# Patient Record
Sex: Male | Born: 1975 | Race: Asian | Hispanic: No | Marital: Married | Smoking: Former smoker
Health system: Southern US, Community
[De-identification: ages and names within clinical notes are randomized; demographics above are authoritative.]

## PROBLEM LIST (undated history)

## (undated) DIAGNOSIS — M109 Gout, unspecified: Secondary | ICD-10-CM

## (undated) DIAGNOSIS — E669 Obesity, unspecified: Secondary | ICD-10-CM

## (undated) DIAGNOSIS — K824 Cholesterolosis of gallbladder: Secondary | ICD-10-CM

## (undated) DIAGNOSIS — K7581 Nonalcoholic steatohepatitis (NASH): Secondary | ICD-10-CM

## (undated) DIAGNOSIS — E119 Type 2 diabetes mellitus without complications: Secondary | ICD-10-CM

## (undated) DIAGNOSIS — M1990 Unspecified osteoarthritis, unspecified site: Secondary | ICD-10-CM

## (undated) DIAGNOSIS — E785 Hyperlipidemia, unspecified: Secondary | ICD-10-CM

## (undated) HISTORY — DX: Cholesterolosis of gallbladder: K82.4

## (undated) HISTORY — DX: Nonalcoholic steatohepatitis (NASH): K75.81

## (undated) HISTORY — DX: Obesity, unspecified: E66.9

## (undated) HISTORY — DX: Gout, unspecified: M10.9

## (undated) HISTORY — DX: Unspecified osteoarthritis, unspecified site: M19.90

## (undated) HISTORY — DX: Type 2 diabetes mellitus without complications: E11.9

## (undated) HISTORY — PX: TONSILLECTOMY: SUR1361

## (undated) HISTORY — DX: Hyperlipidemia, unspecified: E78.5

---
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
## 2011-03-25 HISTORY — PX: FOOT SURGERY: SHX648

## 2012-02-11 ENCOUNTER — Ambulatory Visit (INDEPENDENT_AMBULATORY_CARE_PROVIDER_SITE_OTHER): Payer: BC Managed Care – PPO | Admitting: Family Medicine

## 2012-02-11 ENCOUNTER — Encounter: Payer: Self-pay | Admitting: Family Medicine

## 2012-02-11 VITALS — BP 112/80 | HR 88 | Temp 98.0°F | Ht 63.0 in | Wt 207.0 lb

## 2012-02-11 DIAGNOSIS — E785 Hyperlipidemia, unspecified: Secondary | ICD-10-CM

## 2012-02-11 DIAGNOSIS — E669 Obesity, unspecified: Secondary | ICD-10-CM

## 2012-02-11 DIAGNOSIS — E119 Type 2 diabetes mellitus without complications: Secondary | ICD-10-CM

## 2012-02-11 DIAGNOSIS — E1140 Type 2 diabetes mellitus with diabetic neuropathy, unspecified: Secondary | ICD-10-CM | POA: Insufficient documentation

## 2012-02-11 DIAGNOSIS — M109 Gout, unspecified: Secondary | ICD-10-CM

## 2012-02-11 DIAGNOSIS — E1169 Type 2 diabetes mellitus with other specified complication: Secondary | ICD-10-CM | POA: Insufficient documentation

## 2012-02-11 DIAGNOSIS — E781 Pure hyperglyceridemia: Secondary | ICD-10-CM | POA: Insufficient documentation

## 2012-02-11 DIAGNOSIS — Z23 Encounter for immunization: Secondary | ICD-10-CM

## 2012-02-11 NOTE — Assessment & Plan Note (Signed)
Check uric acid.

## 2012-02-11 NOTE — Assessment & Plan Note (Signed)
Discussed healthy diet, avoiding white starches and decreased carbs in diet. Discussed importance of weight loss.

## 2012-02-11 NOTE — Assessment & Plan Note (Signed)
Check FLP as fasting today

## 2012-02-11 NOTE — Assessment & Plan Note (Signed)
Anticipate poor control given dietary indiscretions endorsed. States has metformin 557m at home. Will start with checking blood work and will call with results, may have him return sooner for f/u depending on results. Discussed diabetes care and reasons to treat. Declines diabetes education currently.

## 2012-02-11 NOTE — Addendum Note (Signed)
Addended by: Royann Shivers A on: 02/11/2012 11:49 AM   Modules accepted: Orders

## 2012-02-11 NOTE — Patient Instructions (Addendum)
Tdap today. Check to see which glucose meter you have at home and let me know to call in strips for you. I want you to start checking sugars at home - fasting. Blood work today - sent to [facility]. Depending on blood work results, I may have you return sooner and discuss starting diabetes medicine.  I'd like you to keep log 1 week prior to next appointment. Watch diet - low carb/starches, avoid white bread/pasta/rice.  Change to whole grain, wheat, and brown.  Avoid donuts!

## 2012-02-11 NOTE — Progress Notes (Signed)
Subjective:    Patient ID: Todd Braun, male    DOB: [birth year], 36 y.o.   MRN: [id number]  HPI CC: new pt to establish  DM - told had diabetes in past - states diet controlled.  Hasn't had a1c checked in a while.  Saw eye doctor 3 mo ago (around 10/2011).  Has LG glucose meter at home, ran out of strips.  Has LG meter.  Was taking 1 pill of 567m daily.  Gout - last flare was 3 wks ago.  3 flares in last year.  Has used colchicine in past but made him sick.  HLD - not on meds.  [place] [employer] [place]  Preventative: Last CPE was 1 yr ago. Fasting today. Declines flu shot. Tetanus shot - Tdap today.  Caffeine: 3-4 cups/night coffee Lives with wife, son (2005) and twins (2012), 1 outside cat Occupation: toolmaker Edu: HS Activity: no regular exercise. Rarely rides bike Diet: good water, fruits/vegetables occasionally  Wt Readings from Last 3 Encounters:  02/11/12 207 lb (93.895 kg)  Body mass index is 36.67 kg/(m^2).  Medications and allergies reviewed and updated in chart.  Past histories reviewed and updated if relevant as below. There is no problem list on file for this patient.  Past Medical History  Diagnosis Date  . Diabetes   . HLD (hyperlipidemia)   . Gout    Past Surgical History  Procedure Date  . Appendectomy 1996  . Tonsillectomy childhood  . Foot surgery 2013    right for arthritis   History  Substance Use Topics  . 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 Smokeless tobacco: Never Used     Comment: "smoked occasionally a long time ago"  . Alcohol Use: Yes     Comment: Socially   Family History  Problem Relation Age of Onset  . CAD Neg Hx   . Stroke Neg Hx   . Diabetes Neg Hx   . Cancer Neg Hx    No Known Allergies No current outpatient prescriptions on file prior to visit.     Review of Systems  Constitutional: Negative for fever, chills, activity change, appetite change, fatigue and unexpected weight change.  HENT:  Negative for hearing loss and neck pain.   Eyes: Positive for visual disturbance.  Respiratory: Negative for cough, chest tightness, shortness of breath and wheezing.   Cardiovascular: Negative for chest pain, palpitations and leg swelling.  Gastrointestinal: Negative for nausea, vomiting, abdominal pain, diarrhea, constipation, blood in stool and abdominal distention.  Genitourinary: Negative for hematuria and difficulty urinating.  Musculoskeletal: Negative for myalgias and arthralgias.  Skin: Negative for rash.  Neurological: Negative for dizziness, seizures, syncope and headaches.  Hematological: Does not bruise/bleed easily.  Psychiatric/Behavioral: Negative for dysphoric mood. The patient is not nervous/anxious.        Objective:   Physical Exam  Nursing note and vitals reviewed. Constitutional: He is oriented to person, place, and time. He appears well-developed and well-nourished. No distress.  HENT:  Head: Normocephalic and atraumatic.  Right Ear: Hearing, tympanic membrane, external ear and ear canal normal.  Left Ear: Hearing, tympanic membrane, external ear and ear canal normal.  Nose: Nose normal.  Mouth/Throat: Oropharynx is clear and moist. No oropharyngeal exudate.  Eyes: Conjunctivae normal and EOM are normal. Pupils are equal, round, and reactive to light. No scleral icterus.  Neck: Normal range of motion. Neck supple. Carotid bruit is not present. No thyromegaly present.  Cardiovascular: Normal rate, regular rhythm, normal  heart sounds and intact distal pulses.   No murmur heard. Pulses:      Radial pulses are 2+ on the right side, and 2+ on the left side.  Pulmonary/Chest: Effort normal and breath sounds normal. No respiratory distress. He has no wheezes. He has no rales.  Abdominal: Soft. Bowel sounds are normal. He exhibits no distension and no mass. There is no tenderness. There is no rebound and no guarding.  Musculoskeletal: Normal range of motion. He exhibits  no edema.       Diabetic foot exam: Normal inspection No skin breakdown No calluses  Normal DP/PT pulses Normal sensation to light touch and monofilament Nails normal R foot with scars from incision from recent surgery  Lymphadenopathy:    He has no cervical adenopathy.  Neurological: He is alert and oriented to person, place, and time.       CN grossly intact, station and gait intact  Skin: Skin is warm and dry. No rash noted.       innumberable skin tags bilateral axillae Some skin darkening below arms  Psychiatric: He has a normal mood and affect. His behavior is normal. Judgment and thought content normal.       Assessment & Plan:

## 2012-02-12 ENCOUNTER — Other Ambulatory Visit: Payer: Self-pay | Admitting: Family Medicine

## 2012-02-12 LAB — LIPID PANEL
Chol/HDL Ratio: 8.9 ratio units — ABNORMAL HIGH (ref 0.0–5.0)
Cholesterol, Total: 257 mg/dL — ABNORMAL HIGH (ref 100–199)
HDL: 29 mg/dL — ABNORMAL LOW (ref >39)
Triglycerides: 501 mg/dL — ABNORMAL HIGH (ref 0–149)

## 2012-02-12 LAB — COMPREHENSIVE METABOLIC PANEL
ALT: 115 IU/L — ABNORMAL HIGH (ref 0–44)
AST: 48 IU/L — ABNORMAL HIGH (ref 0–40)
Albumin/Globulin Ratio: 1.7 (ref 1.1–2.5)
Albumin: 4.6 g/dL (ref 3.5–5.5)
Alkaline Phosphatase: 71 IU/L (ref 39–117)
BUN/Creatinine Ratio: 11 (ref 8–19)
BUN: 11 mg/dL (ref 6–20)
CO2: 24 mmol/L (ref 19–28)
Calcium: 9.7 mg/dL (ref 8.7–10.2)
Chloride: 99 mmol/L (ref 97–108)
Creatinine, Ser: 1.04 mg/dL (ref 0.76–1.27)
GFR calc Af Amer: 107 mL/min/{1.73_m2} (ref >59)
GFR calc non Af Amer: 93 mL/min/{1.73_m2} (ref >59)
Globulin, Total: 2.7 g/dL (ref 1.5–4.5)
Glucose: 156 mg/dL — ABNORMAL HIGH (ref 65–99)
Potassium: 4 mmol/L (ref 3.5–5.2)
Sodium: 140 mmol/L (ref 134–144)
Total Bilirubin: 0.5 mg/dL (ref 0.0–1.2)
Total Protein: 7.3 g/dL (ref 6.0–8.5)

## 2012-02-12 LAB — HEMOGLOBIN A1C
Est. average glucose Bld gHb Est-mCnc: 217 mg/dL
Hgb A1c MFr Bld: 9.2 % — ABNORMAL HIGH (ref 4.8–5.6)

## 2012-02-12 LAB — MICROALBUMIN / CREATININE URINE RATIO
Creatinine, Ur: 192.5 mg/dL (ref 24.0–392.0)
MICROALB/CREAT RATIO: 3.1 mg/g creat (ref 0.0–30.0)
Microalbumin, Urine: 5.9 ug/mL (ref 0.0–17.0)

## 2012-02-12 LAB — URIC ACID: Uric Acid: 9.2 mg/dL — ABNORMAL HIGH (ref 3.7–8.6)

## 2012-02-12 LAB — TSH: TSH: 2.78 u[IU]/mL (ref 0.450–4.500)

## 2012-02-12 MED ORDER — FENOFIBRATE 145 MG PO TABS: 145.0000 mg | ORAL_TABLET | Freq: Every day | ORAL | Status: DC

## 2012-02-12 MED ORDER — METFORMIN HCL 500 MG PO TABS: 500.0000 mg | ORAL_TABLET | Freq: Two times a day (BID) | ORAL | Status: DC

## 2012-02-13 ENCOUNTER — Other Ambulatory Visit: Payer: Self-pay | Admitting: *Deleted

## 2012-02-13 MED ORDER — FENOFIBRATE 145 MG PO TABS: 145.0000 mg | ORAL_TABLET | Freq: Every day | ORAL | Status: DC

## 2012-02-13 MED ORDER — METFORMIN HCL 500 MG PO TABS: 500.0000 mg | ORAL_TABLET | Freq: Two times a day (BID) | ORAL | Status: DC

## 2012-02-13 MED ORDER — LANCETS 30G MISC: 1.0000 | Freq: Every day | Status: AC

## 2012-02-13 MED ORDER — GLUCOSE BLOOD VI STRP: ORAL_STRIP | Status: DC

## 2012-03-12 ENCOUNTER — Ambulatory Visit (INDEPENDENT_AMBULATORY_CARE_PROVIDER_SITE_OTHER): Payer: BC Managed Care – PPO | Admitting: Family Medicine

## 2012-03-12 ENCOUNTER — Encounter: Payer: Self-pay | Admitting: Family Medicine

## 2012-03-12 VITALS — BP 116/78 | HR 84 | Temp 97.7°F | Wt 207.0 lb

## 2012-03-12 DIAGNOSIS — R7401 Elevation of levels of liver transaminase levels: Secondary | ICD-10-CM

## 2012-03-12 DIAGNOSIS — M109 Gout, unspecified: Secondary | ICD-10-CM

## 2012-03-12 DIAGNOSIS — E119 Type 2 diabetes mellitus without complications: Secondary | ICD-10-CM

## 2012-03-12 DIAGNOSIS — K760 Fatty (change of) liver, not elsewhere classified: Secondary | ICD-10-CM | POA: Insufficient documentation

## 2012-03-12 DIAGNOSIS — K7581 Nonalcoholic steatohepatitis (NASH): Secondary | ICD-10-CM | POA: Insufficient documentation

## 2012-03-12 DIAGNOSIS — E669 Obesity, unspecified: Secondary | ICD-10-CM

## 2012-03-12 DIAGNOSIS — E785 Hyperlipidemia, unspecified: Secondary | ICD-10-CM

## 2012-03-12 MED ORDER — METFORMIN HCL 500 MG PO TABS: 500.0000 mg | ORAL_TABLET | Freq: Two times a day (BID) | ORAL | Status: DC

## 2012-03-12 MED ORDER — TRAMADOL HCL 50 MG PO TABS: 50.0000 mg | ORAL_TABLET | Freq: Two times a day (BID) | ORAL | Status: DC | PRN

## 2012-03-12 MED ORDER — FENOFIBRATE 145 MG PO TABS: 145.0000 mg | ORAL_TABLET | Freq: Every day | ORAL | Status: DC

## 2012-03-12 NOTE — Assessment & Plan Note (Signed)
New.  If remaining elevated, check iron panel, viral hepatitis panel, and abd [place]. Pt agrees with plan. Encouraged avoiding tylenol and alcohol.

## 2012-03-12 NOTE — Assessment & Plan Note (Signed)
Again encouraged weight loss.

## 2012-03-12 NOTE — Assessment & Plan Note (Signed)
Uncontrolled as of latest A1c. Reviewed diabetes care checklist, discussed implications of diabetes and monitoring of diabetes. rec schedule vision exam. rec start metformin and slowly titrate to 544m bid. Discussed may need 2nd med if doesn't take diet/lifestyle changes seriously. rec start keeping track of sugars, return in 257moith 1 wk log of sugars prior to next appt. Prescribed type 2 diabetes overview and primer on checking sugars via [employer].

## 2012-03-12 NOTE — Patient Instructions (Addendum)
You have diabetes with an A1c of 9.2% Goal sugar if fasting is 80-120. Goal sugar if 2 hours after meal <200. Let's start checking sugars either fasting in am or 2 hours after eating.   Let's start metformin at 588m nightly for 1 week, then increase to 5059mtwice daily. If any more gout attacks (joint pains), we will need to consider starting gout medicine to help decrease uric acid in body. We need to monitor liver function as yours was slightly elevated last check.  Hopefull ywith better sugar control liver will normalize, if not we will obtain abdominal ultrasound. Return in 2 months for follow up, prior come in for blood work if you can. [employer] prescription for online diabetes education provided today.

## 2012-03-12 NOTE — Progress Notes (Signed)
  Subjective:    Patient ID: Todd Braun, male    DOB: 10-Dec-1975, 36 y.o.   MRN: [id number]  HPI CC: 1 mo f/u  DM - A1c was 9.2% last month.  Advised to start metformin, did not start.  Say eye doctor 10/2011.  Has LG glucose meter at home, refilled strips last visit, but has not started .  Some paresthesias of bilateral big toes.  Gout - last flare was 3 wks ago. 3 flares in last year. Has used colchicine in past but made him sick. States was drinking lots of liquor and beer.  States has backed off of this.  HLD - found to have hypertriglyceridemia.  transaminitis - doesn't use tylenol.  Did have increased EtOH intake recently, attributes to this.  Has backed off EtOH.  Denies hepatitis exposure.  Doesn't know if has had Hep A or B shots.  No h/o HTN.  Past Medical History  Diagnosis Date  . T2DM (type 2 diabetes mellitus)   . HLD (hyperlipidemia)   . Gout   . Obesity     Review of Systems Per HPI    Objective:   Physical Exam  Nursing note and vitals reviewed. Constitutional: He appears well-developed and well-nourished. No distress.       obese  HENT:  Head: Normocephalic and atraumatic.  Mouth/Throat: Oropharynx is clear and moist. No oropharyngeal exudate.  Eyes: Conjunctivae normal and EOM are normal. Pupils are equal, round, and reactive to light. No scleral icterus.  Neck: Normal range of motion. Neck supple.  Cardiovascular: Normal rate, regular rhythm, normal heart sounds and intact distal pulses.   No murmur heard. Pulmonary/Chest: Effort normal and breath sounds normal. No respiratory distress. He has no wheezes. He has no rales.  Musculoskeletal: He exhibits no edema.  Skin: Skin is warm and dry. No rash noted.  Psychiatric: He has a normal mood and affect.       Assessment & Plan:

## 2012-03-12 NOTE — Assessment & Plan Note (Addendum)
Lab Results  Component Value Date   HDL 29* 02/11/2012   LDLCALC Comment 02/11/2012   TRIG 501* 02/11/2012   CHOLHDL 8.9* 02/11/2012  compliant and tolerant of fenofibrate.

## 2012-03-12 NOTE — Assessment & Plan Note (Signed)
UA elevated at 9's. Discussed if rpt flare, will recommend start allopurinol. Lab Results  Component Value Date   CREATININE 1.04 02/11/2012

## 2012-05-08 ENCOUNTER — Other Ambulatory Visit: Payer: Self-pay

## 2012-05-19 ENCOUNTER — Ambulatory Visit (INDEPENDENT_AMBULATORY_CARE_PROVIDER_SITE_OTHER): Payer: BC Managed Care – PPO | Admitting: Family Medicine

## 2012-05-19 ENCOUNTER — Encounter: Payer: Self-pay | Admitting: Family Medicine

## 2012-05-19 VITALS — BP 114/72 | HR 88 | Temp 97.8°F | Wt 205.8 lb

## 2012-05-19 DIAGNOSIS — E1142 Type 2 diabetes mellitus with diabetic polyneuropathy: Secondary | ICD-10-CM

## 2012-05-19 DIAGNOSIS — E781 Pure hyperglyceridemia: Secondary | ICD-10-CM

## 2012-05-19 DIAGNOSIS — E1140 Type 2 diabetes mellitus with diabetic neuropathy, unspecified: Secondary | ICD-10-CM

## 2012-05-19 DIAGNOSIS — M109 Gout, unspecified: Secondary | ICD-10-CM

## 2012-05-19 DIAGNOSIS — E1149 Type 2 diabetes mellitus with other diabetic neurological complication: Secondary | ICD-10-CM

## 2012-05-19 DIAGNOSIS — R7401 Elevation of levels of liver transaminase levels: Secondary | ICD-10-CM

## 2012-05-19 LAB — LIPID PANEL
Cholesterol: 209 mg/dL — ABNORMAL HIGH (ref 0–200)
HDL: 30.2 mg/dL — ABNORMAL LOW (ref >39.00)
Total CHOL/HDL Ratio: 7
Triglycerides: 333 mg/dL — ABNORMAL HIGH (ref 0.0–149.0)
VLDL: 66.6 mg/dL — ABNORMAL HIGH (ref 0.0–40.0)

## 2012-05-19 LAB — COMPREHENSIVE METABOLIC PANEL
ALT: 77 U/L — ABNORMAL HIGH (ref 0–53)
AST: 36 U/L (ref 0–37)
Albumin: 4.4 g/dL (ref 3.5–5.2)
Alkaline Phosphatase: 43 U/L (ref 39–117)
BUN: 12 mg/dL (ref 6–23)
CO2: 28 mEq/L (ref 19–32)
Calcium: 9.5 mg/dL (ref 8.4–10.5)
Chloride: 103 mEq/L (ref 96–112)
Creatinine, Ser: 1 mg/dL (ref 0.4–1.5)
GFR: 86.13 mL/min (ref >60.00)
Glucose, Bld: 89 mg/dL (ref 70–99)
Potassium: 3.4 mEq/L — ABNORMAL LOW (ref 3.5–5.1)
Sodium: 139 mEq/L (ref 135–145)
Total Bilirubin: 0.6 mg/dL (ref 0.3–1.2)
Total Protein: 8 g/dL (ref 6.0–8.3)

## 2012-05-19 LAB — LDL CHOLESTEROL, DIRECT: Direct LDL: 139.6 mg/dL

## 2012-05-19 LAB — HEMOGLOBIN A1C: Hgb A1c MFr Bld: 7.2 % — ABNORMAL HIGH (ref 4.6–6.5)

## 2012-05-19 MED ORDER — BLOOD GLUCOSE METER KIT: PACK | Status: DC

## 2012-05-19 MED ORDER — METFORMIN HCL ER 750 MG PO TB24: 750.0000 mg | ORAL_TABLET | Freq: Every day | ORAL | Status: DC

## 2012-05-19 NOTE — Assessment & Plan Note (Signed)
recheck FLP today.  Tolerating fenofibrate.

## 2012-05-19 NOTE — Assessment & Plan Note (Signed)
recheck today.

## 2012-05-19 NOTE — Patient Instructions (Addendum)
Diabetes checklist provided today.  Also re-prescribed online resource. Start long acting metformin once daily in the morning. Schedule eye exam. return in 3 months for follow up

## 2012-05-19 NOTE — Assessment & Plan Note (Addendum)
Chronic, uncontrolled. Intolerant of IR metformin - will try extended release metformin. Somewhat resistant to meds. Recommended schedule vision exam. I've resent glucose meter to pharmacy. Declines DSME. Foot exam normal today but pt continues to endorse paresthesias. emmi solutions prescribed today.

## 2012-05-19 NOTE — Assessment & Plan Note (Signed)
Stable

## 2012-05-19 NOTE — Progress Notes (Signed)
  Subjective:    Patient ID: Todd Braun, male    DOB: 01-07-1976, 37 y.o.   MRN: [id number]  HPI CC: f/u DM  DM - takes metformin only once daily.  Finds causing diarrhea.  Has only been taking metformin 550m once daily 2/2 this.  glucometer broke.  Has a lot of LG strips.  No recent eye exam.  Foot exam today.  Some foot paresthesias endorsed, worse with prolonged standing. Lab Results  Component Value Date   HGBA1C 9.2* 02/11/2012    Gout - no recent flares  Transaminitis - knows to avoid a lot of alcohol and tylenol  Hypertriglyceridemia - started tricor last visit.  Tolerating well.  Past Medical History  Diagnosis Date  . T2DM (type 2 diabetes mellitus)   . HLD (hyperlipidemia)   . Gout   . Obesity      Review of Systems Per HPI    Objective:   Physical Exam  Nursing note and vitals reviewed. Constitutional: He appears well-developed and well-nourished. No distress.  HENT:  Head: Normocephalic and atraumatic.  Right Ear: External ear normal.  Left Ear: External ear normal.  Nose: Nose normal.  Mouth/Throat: Oropharynx is clear and moist. No oropharyngeal exudate.  Eyes: Conjunctivae and EOM are normal. Pupils are equal, round, and reactive to light. No scleral icterus.  Neck: Normal range of motion. Neck supple.  Cardiovascular: Normal rate, regular rhythm, normal heart sounds and intact distal pulses.   No murmur heard. Pulmonary/Chest: Effort normal and breath sounds normal. No respiratory distress. He has no wheezes. He has no rales.  Musculoskeletal: He exhibits no edema.  Diabetic foot exam: Normal inspection No skin breakdown No calluses  Normal DP/PT pulses Normal sensation to light tough and monofilament Nails normal  Lymphadenopathy:    He has no cervical adenopathy.  Skin: Skin is warm and dry. No rash noted.  Psychiatric: He has a normal mood and affect.       Assessment & Plan:

## 2012-08-18 ENCOUNTER — Encounter: Payer: Self-pay | Admitting: Family Medicine

## 2012-08-18 ENCOUNTER — Ambulatory Visit (INDEPENDENT_AMBULATORY_CARE_PROVIDER_SITE_OTHER): Payer: BC Managed Care – PPO | Admitting: Family Medicine

## 2012-08-18 VITALS — BP 124/88 | HR 80 | Temp 98.1°F | Wt 203.8 lb

## 2012-08-18 DIAGNOSIS — E1140 Type 2 diabetes mellitus with diabetic neuropathy, unspecified: Secondary | ICD-10-CM

## 2012-08-18 DIAGNOSIS — E669 Obesity, unspecified: Secondary | ICD-10-CM

## 2012-08-18 DIAGNOSIS — E1149 Type 2 diabetes mellitus with other diabetic neurological complication: Secondary | ICD-10-CM

## 2012-08-18 DIAGNOSIS — E1142 Type 2 diabetes mellitus with diabetic polyneuropathy: Secondary | ICD-10-CM

## 2012-08-18 DIAGNOSIS — E1165 Type 2 diabetes mellitus with hyperglycemia: Secondary | ICD-10-CM

## 2012-08-18 DIAGNOSIS — E781 Pure hyperglyceridemia: Secondary | ICD-10-CM

## 2012-08-18 LAB — HEMOGLOBIN A1C: Hgb A1c MFr Bld: 7.1 % — ABNORMAL HIGH (ref 4.6–6.5)

## 2012-08-18 LAB — BASIC METABOLIC PANEL
BUN: 12 mg/dL (ref 6–23)
CO2: 26 mEq/L (ref 19–32)
Calcium: 9.3 mg/dL (ref 8.4–10.5)
Chloride: 105 mEq/L (ref 96–112)
Creatinine, Ser: 1 mg/dL (ref 0.4–1.5)
GFR: 95.48 mL/min (ref >60.00)
Glucose, Bld: 95 mg/dL (ref 70–99)
Potassium: 4 mEq/L (ref 3.5–5.1)
Sodium: 137 mEq/L (ref 135–145)

## 2012-08-18 MED ORDER — FENOFIBRATE 145 MG PO TABS: 145.0000 mg | ORAL_TABLET | Freq: Every day | ORAL | Status: DC

## 2012-08-18 MED ORDER — METFORMIN HCL ER 750 MG PO TB24: 750.0000 mg | ORAL_TABLET | Freq: Every day | ORAL | Status: DC

## 2012-08-18 NOTE — Assessment & Plan Note (Signed)
Recommended refill tricor.

## 2012-08-18 NOTE — Assessment & Plan Note (Addendum)
Encouraged continued weight loss through diabetic diet compliance.

## 2012-08-18 NOTE — Progress Notes (Signed)
  Subjective:    Patient ID: Todd Braun, male    DOB: [birth year], 37 y.o.   MRN: [id number]  HPI CC: 78mof/u DM  THuttonreturns today for 3 mo f/u DM  DM - taking IR metformin 503mdaily still 2/2 insurance concerns (needed XR form sent [facility] but didn't tell me).  Has taken extended release in past and tolerated well. Did not pick up glucose meter so has not been checking sugars. Avoiding sweets. This month hasn't been as compliant with diet as prior. Declines DSME.  Did not complete [employer] education prescribed twice in past. Denies paresthesias. Taking herbal supplement that wife bought for diabetes, unsure what this is. Lab Results  Component Value Date   HGBA1C 7.2* 05/19/2012    Wt Readings from Last 3 Encounters:  08/18/12 203 lb 12 oz (92.42 kg)  05/19/12 205 lb 12 oz (93.328 kg)  03/12/12 207 lb (93.895 kg)    Hypertriglyceridemia - ran out of fibrate 2 wks ago, didn't refill.  Prior was tolerating well.  Sedentary at work.  Works weekend shift.  Past Medical History  Diagnosis Date  . T2DM (type 2 diabetes mellitus)   . HLD (hyperlipidemia)   . Gout   . Obesity     Review of Systems Per HPI    Objective:   Physical Exam  Nursing note and vitals reviewed. Constitutional: He appears well-developed and well-nourished. No distress.  HENT:  Head: Normocephalic and atraumatic.  Mouth/Throat: Oropharynx is clear and moist. No oropharyngeal exudate.  Eyes: Conjunctivae and EOM are normal. Pupils are equal, round, and reactive to light. No scleral icterus.  Neck: Normal range of motion. Neck supple.  Cardiovascular: Normal rate, regular rhythm, normal heart sounds and intact distal pulses.   No murmur heard. Pulmonary/Chest: Effort normal and breath sounds normal. No respiratory distress. He has no wheezes. He has no rales.  Musculoskeletal: He exhibits no edema.  Lymphadenopathy:    He has no cervical adenopathy.  Skin: Skin is warm and dry. No  rash noted.  Psychiatric: He has a normal mood and affect.       Assessment & Plan:

## 2012-08-18 NOTE — Assessment & Plan Note (Addendum)
Chronic, uncontrolled. Difficult to control as he doesn't check sugars and isn't compliant with med recommendations. Currently taking metformin 581m IR daily. I've sent in metformin 755mXR daily [facility], advised him to start taking. Again discussed diabetes is a chronic disease that needs daily monitoring/treatment. Recommended pick up glucose meter (second time he doesn't pick this up) - any type that has cheapest test strips. A1c today. When returns, I've asked him to bring in 1 wk log of sugars to review.

## 2012-08-18 NOTE — Patient Instructions (Addendum)
Bring in herbal supplement. Check with [facility] about fenofibrate (cholesterol medicine). Good to see you today, call us with questions. Pick up glucose meter to keep track of sugars. Return in 6 months for follow up.  Keep log of fasting sugars 1 week prior to appointment and bring into next appointment Goal fasting sugar is 80-120, goal sugar 2 hours after meal is <180.

## 2012-09-30 ENCOUNTER — Other Ambulatory Visit: Payer: Self-pay

## 2012-11-22 LAB — HM DIABETES EYE EXAM

## 2013-01-27 ENCOUNTER — Other Ambulatory Visit: Payer: Self-pay

## 2013-02-23 ENCOUNTER — Encounter: Payer: Self-pay | Admitting: Family Medicine

## 2013-02-23 ENCOUNTER — Ambulatory Visit (INDEPENDENT_AMBULATORY_CARE_PROVIDER_SITE_OTHER): Payer: BC Managed Care – PPO | Admitting: Family Medicine

## 2013-02-23 VITALS — BP 108/68 | HR 88 | Temp 98.4°F | Ht 63.75 in | Wt 213.0 lb

## 2013-02-23 DIAGNOSIS — E1149 Type 2 diabetes mellitus with other diabetic neurological complication: Secondary | ICD-10-CM

## 2013-02-23 DIAGNOSIS — E781 Pure hyperglyceridemia: Secondary | ICD-10-CM

## 2013-02-23 DIAGNOSIS — Z23 Encounter for immunization: Secondary | ICD-10-CM

## 2013-02-23 DIAGNOSIS — M109 Gout, unspecified: Secondary | ICD-10-CM

## 2013-02-23 DIAGNOSIS — J069 Acute upper respiratory infection, unspecified: Secondary | ICD-10-CM

## 2013-02-23 DIAGNOSIS — E1140 Type 2 diabetes mellitus with diabetic neuropathy, unspecified: Secondary | ICD-10-CM

## 2013-02-23 DIAGNOSIS — E1142 Type 2 diabetes mellitus with diabetic polyneuropathy: Secondary | ICD-10-CM

## 2013-02-23 LAB — LIPID PANEL
Cholesterol: 252 mg/dL — ABNORMAL HIGH (ref 0–200)
HDL: 31.3 mg/dL — ABNORMAL LOW (ref >39.00)
Total CHOL/HDL Ratio: 8
Triglycerides: 367 mg/dL — ABNORMAL HIGH (ref 0.0–149.0)
VLDL: 73.4 mg/dL — ABNORMAL HIGH (ref 0.0–40.0)

## 2013-02-23 LAB — LDL CHOLESTEROL, DIRECT: Direct LDL: 153.9 mg/dL

## 2013-02-23 LAB — BASIC METABOLIC PANEL
BUN: 18 mg/dL (ref 6–23)
CO2: 27 mEq/L (ref 19–32)
Calcium: 9.7 mg/dL (ref 8.4–10.5)
Chloride: 101 mEq/L (ref 96–112)
Creatinine, Ser: 1.1 mg/dL (ref 0.4–1.5)
GFR: 82.99 mL/min (ref >60.00)
Glucose, Bld: 131 mg/dL — ABNORMAL HIGH (ref 70–99)
Potassium: 4.1 mEq/L (ref 3.5–5.1)
Sodium: 137 mEq/L (ref 135–145)

## 2013-02-23 LAB — HEMOGLOBIN A1C: Hgb A1c MFr Bld: 8 % — ABNORMAL HIGH (ref 4.6–6.5)

## 2013-02-23 LAB — MICROALBUMIN / CREATININE URINE RATIO
Creatinine,U: 102.9 mg/dL
Microalb Creat Ratio: 0.9 mg/g (ref 0.0–30.0)
Microalb, Ur: 0.9 mg/dL (ref 0.0–1.9)

## 2013-02-23 NOTE — Progress Notes (Signed)
Pre-visit discussion using our clinic review tool. No additional management support is needed unless otherwise documented below in the visit note.  

## 2013-02-23 NOTE — Assessment & Plan Note (Signed)
Anticipate viral - discussed this.  Update if sxs persist.

## 2013-02-23 NOTE — Assessment & Plan Note (Signed)
Stable on tricor - continue. Discussed increased fish in diet, handout from [employer] provided.

## 2013-02-23 NOTE — Addendum Note (Signed)
Addended by: Ria Bush on: 02/23/2013 11:21 AM   Modules accepted: Orders

## 2013-02-23 NOTE — Progress Notes (Signed)
   Subjective:    Patient ID: Todd Braun, male    DOB: [birth year], 37 y.o.   MRN: [id number]  HPI CC: DM f/u  DM - off metformin because all types caused diarrhea.  Doesn't check sugars.  Declined DSME.  Home glucometer is broken.  Foot exam today.  States had eye exam 3-4 mo ago.  No hypoglycemic sxs.  No paresthesias.  No chest pain/tightness.   Hypertriglyceridemia - tolerating fenofibrate.  Gout - no recent flares.  Some congestion, ST, cough.  No fevers.  No HA, facial pressure.  No sick contacts at home.  No smokers at home.  Wt Readings from Last 3 Encounters:  02/23/13 213 lb (96.616 kg)  08/18/12 203 lb 12 oz (92.42 kg)  05/19/12 205 lb 12 oz (93.328 kg)    Past Medical History  Diagnosis Date  . T2DM (type 2 diabetes mellitus)   . HLD (hyperlipidemia)   . Gout   . Obesity      Review of Systems Per HPI    Objective:   Physical Exam  Nursing note and vitals reviewed. Constitutional: He appears well-developed and well-nourished. No distress.  HENT:  Head: Normocephalic and atraumatic.  Right Ear: Tympanic membrane, external ear and ear canal normal.  Left Ear: Tympanic membrane, external ear and ear canal normal.  Nose: Nose normal. No mucosal edema or rhinorrhea. Right sinus exhibits no maxillary sinus tenderness and no frontal sinus tenderness. Left sinus exhibits no maxillary sinus tenderness and no frontal sinus tenderness.  Mouth/Throat: Oropharynx is clear and moist. No oropharyngeal exudate.  Eyes: Conjunctivae and EOM are normal. Pupils are equal, round, and reactive to light. No scleral icterus.  Neck: Normal range of motion. Neck supple.  Cardiovascular: Normal rate, regular rhythm, normal heart sounds and intact distal pulses.   No murmur heard. Pulmonary/Chest: Effort normal and breath sounds normal. No respiratory distress. He has no wheezes. He has no rales.  Musculoskeletal: He exhibits no edema.  Diabetic foot exam: Normal inspection No skin  breakdown No calluses  Normal DP/PT pulses Normal sensation to light touch and monofilament Nails normal  Lymphadenopathy:    He has no cervical adenopathy.  Skin: Skin is warm and dry. No rash noted.  Psychiatric: He has a normal mood and affect.       Assessment & Plan:

## 2013-02-23 NOTE — Addendum Note (Signed)
Addended by: Royann Shivers A on: 02/23/2013 12:01 PM   Modules accepted: Orders

## 2013-02-23 NOTE — Patient Instructions (Addendum)
Flu shot. Blood work today.  Urine check today. We will call you with results and new medicine if needed. I think you have viral infection - treat with supportive care and immediate release guaifenesin with plenty of fluid to mobilize mucous.  Let me know if not improving with this

## 2013-02-23 NOTE — Assessment & Plan Note (Signed)
Stable without recent flares.

## 2013-02-23 NOTE — Assessment & Plan Note (Signed)
Chronic, stable. Does not check sugars. Check A1c today, stop meformin as unable to tolerate. Discussed possible new med if A1c elevated.

## 2013-02-27 ENCOUNTER — Other Ambulatory Visit: Payer: Self-pay | Admitting: Family Medicine

## 2013-02-27 MED ORDER — SITAGLIPTIN PHOSPHATE 50 MG PO TABS: 50.0000 mg | ORAL_TABLET | Freq: Every day | ORAL | Status: DC

## 2013-04-21 ENCOUNTER — Encounter: Payer: Self-pay | Admitting: Family Medicine

## 2013-04-21 ENCOUNTER — Other Ambulatory Visit: Payer: Self-pay | Admitting: Family Medicine

## 2013-04-21 MED ORDER — SITAGLIPTIN PHOSPHATE 50 MG PO TABS: 50.0000 mg | ORAL_TABLET | Freq: Every day | ORAL | Status: DC

## 2013-05-09 ENCOUNTER — Encounter: Payer: BC Managed Care – PPO | Admitting: Family Medicine

## 2013-05-22 DIAGNOSIS — K824 Cholesterolosis of gallbladder: Secondary | ICD-10-CM

## 2013-05-22 DIAGNOSIS — K7581 Nonalcoholic steatohepatitis (NASH): Secondary | ICD-10-CM

## 2013-05-22 HISTORY — DX: Nonalcoholic steatohepatitis (NASH): K75.81

## 2013-05-22 HISTORY — DX: Cholesterolosis of gallbladder: K82.4

## 2013-05-27 ENCOUNTER — Encounter: Payer: Self-pay | Admitting: Family Medicine

## 2013-05-27 ENCOUNTER — Ambulatory Visit (INDEPENDENT_AMBULATORY_CARE_PROVIDER_SITE_OTHER): Payer: BC Managed Care – PPO | Admitting: Family Medicine

## 2013-05-27 VITALS — BP 124/70 | HR 78 | Temp 97.9°F | Ht 62.0 in | Wt 212.0 lb

## 2013-05-27 DIAGNOSIS — E1140 Type 2 diabetes mellitus with diabetic neuropathy, unspecified: Secondary | ICD-10-CM

## 2013-05-27 DIAGNOSIS — R7402 Elevation of levels of lactic acid dehydrogenase (LDH): Secondary | ICD-10-CM

## 2013-05-27 DIAGNOSIS — E1142 Type 2 diabetes mellitus with diabetic polyneuropathy: Secondary | ICD-10-CM

## 2013-05-27 DIAGNOSIS — E1165 Type 2 diabetes mellitus with hyperglycemia: Secondary | ICD-10-CM

## 2013-05-27 DIAGNOSIS — IMO0002 Reserved for concepts with insufficient information to code with codable children: Secondary | ICD-10-CM

## 2013-05-27 DIAGNOSIS — E669 Obesity, unspecified: Secondary | ICD-10-CM

## 2013-05-27 DIAGNOSIS — R7401 Elevation of levels of liver transaminase levels: Secondary | ICD-10-CM

## 2013-05-27 DIAGNOSIS — M109 Gout, unspecified: Secondary | ICD-10-CM

## 2013-05-27 DIAGNOSIS — E781 Pure hyperglyceridemia: Secondary | ICD-10-CM

## 2013-05-27 DIAGNOSIS — Z0000 Encounter for general adult medical examination without abnormal findings: Secondary | ICD-10-CM

## 2013-05-27 DIAGNOSIS — E1149 Type 2 diabetes mellitus with other diabetic neurological complication: Secondary | ICD-10-CM

## 2013-05-27 DIAGNOSIS — R740 Nonspecific elevation of levels of transaminase and lactic acid dehydrogenase [LDH]: Secondary | ICD-10-CM

## 2013-05-27 LAB — COMPREHENSIVE METABOLIC PANEL
ALT: 109 U/L — ABNORMAL HIGH (ref 0–53)
AST: 58 U/L — ABNORMAL HIGH (ref 0–37)
Albumin: 4.2 g/dL (ref 3.5–5.2)
Alkaline Phosphatase: 45 U/L (ref 39–117)
BUN: 13 mg/dL (ref 6–23)
CO2: 28 mEq/L (ref 19–32)
Calcium: 10 mg/dL (ref 8.4–10.5)
Chloride: 104 mEq/L (ref 96–112)
Creatinine, Ser: 1.1 mg/dL (ref 0.4–1.5)
GFR: 79.43 mL/min (ref >60.00)
Glucose, Bld: 117 mg/dL — ABNORMAL HIGH (ref 70–99)
Potassium: 3.9 mEq/L (ref 3.5–5.1)
Sodium: 140 mEq/L (ref 135–145)
Total Bilirubin: 0.7 mg/dL (ref 0.3–1.2)
Total Protein: 7.8 g/dL (ref 6.0–8.3)

## 2013-05-27 LAB — HEMOGLOBIN A1C: Hgb A1c MFr Bld: 8.4 % — ABNORMAL HIGH (ref 4.6–6.5)

## 2013-05-27 LAB — LIPID PANEL
Cholesterol: 224 mg/dL — ABNORMAL HIGH (ref 0–200)
HDL: 29.9 mg/dL — ABNORMAL LOW (ref >39.00)
LDL Cholesterol: 148 mg/dL — ABNORMAL HIGH (ref 0–99)
Total CHOL/HDL Ratio: 7
Triglycerides: 229 mg/dL — ABNORMAL HIGH (ref 0.0–149.0)
VLDL: 45.8 mg/dL — ABNORMAL HIGH (ref 0.0–40.0)

## 2013-05-27 LAB — MICROALBUMIN / CREATININE URINE RATIO
Creatinine,U: 79.7 mg/dL
Microalb Creat Ratio: 0.9 mg/g (ref 0.0–30.0)
Microalb, Ur: 0.7 mg/dL (ref 0.0–1.9)

## 2013-05-27 LAB — URIC ACID: Uric Acid, Serum: 5.8 mg/dL (ref 4.0–7.8)

## 2013-05-27 NOTE — Patient Instructions (Addendum)
Blood work today. We will titrate meds according to blood work - may start second cholesterol medicine.  In interim, try to increase fish in diet. Work on increased regular activity - goal activity is 130mn /wk of moderate intensity aerobic exercise. Return in 5-6 months for follow up. Good to see you today, [place] questions.

## 2013-05-27 NOTE — Assessment & Plan Note (Signed)
Chronic, uncontrolled. Recheck FLP today. Continue tricor, discussed increased fish in diet.

## 2013-05-27 NOTE — Assessment & Plan Note (Signed)
Chronic, stable off meds without recent flare.  Check UA today

## 2013-05-27 NOTE — Assessment & Plan Note (Signed)
Chronic, uncontrolled. Check A1c today, titrate meds accordingly.

## 2013-05-27 NOTE — Assessment & Plan Note (Signed)
Discussed incorporating exercise into routine.  Discussed recommended 138mn/wk aerobic exercise, and to start slow 10-15 min 2-3 times weekly, slowly increase activity to goal.

## 2013-05-27 NOTE — Progress Notes (Signed)
BP 124/70  Pulse 78  Temp(Src) 97.9 F (36.6 C) (Oral)  Ht 5' 2"  (1.575 m)  Wt 212 lb (96.163 kg)  BMI 38.77 kg/m2  SpO2 96%   CC: CPE  Subjective:    Patient ID: Todd Braun, male    DOB: 07-Nov-1975, 38 y.o.   MRN: [id number]  HPI: Todd Braun is a 38 y.o. male presenting on 05/27/2013 with Annual Exam   Todd Braun presents today for annual exam.  DM - last visit we started januvia 54m daily - tolerating well.  Doesn't check sugars. Declined DSME. Home glucometer is broken.   Wt Readings from Last 3 Encounters:  05/27/13 212 lb (96.163 kg)  02/23/13 213 lb (96.616 kg)  08/18/12 203 lb 12 oz (92.42 kg)  Body mass index is 38.77 kg/(m^2).   Preventative:  Flu shot 02/2013 Tdap 2013 Pneumonia shot - declines  Caffeine: 3-4 cups/night coffee Lives with wife, son (2005) and twins (2012), 1 outside cat Occupation: toolmaker Edu: HS Activity: no regular exercise. rarely rides bike. Diet: good water, fruits/vegetables occasionally  Relevant past medical, surgical, family and social history reviewed and updated as indicated.  Allergies and medications reviewed and updated. Current Outpatient Prescriptions on File Prior to Visit  Medication Sig  . Blood Glucose Monitoring Suppl (BLOOD GLUCOSE METER) kit Use as instructed  . etodolac (LODINE) 500 MG tablet Take 500 mg by mouth 2 (two) times daily as needed.  . fenofibrate (TRICOR) 145 MG tablet Take 1 tablet (145 mg total) by mouth daily.  .Marland Kitchenglucose blood (GE100 BLOOD GLUCOSE TEST) test strip Use to check sugar once daily and as needed.  . Lancets 30G MISC 1 each by Does not apply route daily.  . sitaGLIPtin (JANUVIA) 50 MG tablet Take 1 tablet (50 mg total) by mouth daily.  . traMADol (ULTRAM) 50 MG tablet Take 1 tablet (50 mg total) by mouth 2 (two) times daily as needed.   No current facility-administered medications on file prior to visit.    Review of Systems  Constitutional: Negative for fever, chills, activity change,  appetite change, fatigue and unexpected weight change.  HENT: Negative for hearing loss.   Eyes: Negative for visual disturbance.  Respiratory: Negative for cough, chest tightness, shortness of breath and wheezing.   Cardiovascular: Negative for chest pain, palpitations and leg swelling.  Gastrointestinal: Negative for nausea, vomiting, abdominal pain, diarrhea, constipation, blood in stool and abdominal distention.  Genitourinary: Negative for hematuria and difficulty urinating.  Musculoskeletal: Negative for arthralgias, myalgias and neck pain.  Skin: Negative for rash.  Neurological: Negative for dizziness, seizures, syncope and headaches.  Hematological: Negative for adenopathy. Does not bruise/bleed easily.  Psychiatric/Behavioral: Negative for dysphoric mood. The patient is not nervous/anxious.    Per HPI unless specifically indicated above    Objective:    BP 124/70  Pulse 78  Temp(Src) 97.9 F (36.6 C) (Oral)  Ht 5' 2"  (1.575 m)  Wt 212 lb (96.163 kg)  BMI 38.77 kg/m2  SpO2 96%  Physical Exam  Nursing note and vitals reviewed. Constitutional: He is oriented to person, place, and time. He appears well-developed and well-nourished. No distress.  HENT:  Head: Normocephalic and atraumatic.  Right Ear: Hearing, tympanic membrane, external ear and ear canal normal.  Left Ear: Hearing, tympanic membrane, external ear and ear canal normal.  Nose: Nose normal.  Mouth/Throat: Uvula is midline, oropharynx is clear and moist and mucous membranes are normal. No oropharyngeal exudate, posterior oropharyngeal edema or posterior oropharyngeal erythema.  Eyes: Conjunctivae and EOM are normal. Pupils are equal, round, and reactive to light. No scleral icterus.  Neck: Normal range of motion. Neck supple. No thyromegaly present.  Cardiovascular: Normal rate, regular rhythm, normal heart sounds and intact distal pulses.   No murmur heard. Pulses:      Radial pulses are 2+ on the right side,  and 2+ on the left side.  Pulmonary/Chest: Effort normal and breath sounds normal. No respiratory distress. He has no wheezes. He has no rales.  Abdominal: Soft. Bowel sounds are normal. He exhibits no distension and no mass. There is no tenderness. There is no rebound and no guarding.  Musculoskeletal: Normal range of motion. He exhibits no edema.  Lymphadenopathy:    He has no cervical adenopathy.  Neurological: He is alert and oriented to person, place, and time.  CN grossly intact, station and gait intact  Skin: Skin is warm and dry. No rash noted.  Psychiatric: He has a normal mood and affect. His behavior is normal. Judgment and thought content normal.       Assessment & Plan:   Problem List Items Addressed This Visit   Gout     Chronic, stable off meds without recent flare.  Check UA today    Relevant Orders      Uric acid   Health maintenance examination - Primary     Preventative protocols reviewed and updated unless pt declined. Discussed healthy diet and lifestyle.     Hypertriglyceridemia     Chronic, uncontrolled. Recheck FLP today. Continue tricor, discussed increased fish in diet.    Relevant Orders      Lipid panel   Obesity     Discussed incorporating exercise into routine.  Discussed recommended 154mn/wk aerobic exercise, and to start slow 10-15 min 2-3 times weekly, slowly increase activity to goal.    Transaminitis     Recheck today.    Uncontrolled type 2 diabetes with neuropathy     Chronic, uncontrolled. Check A1c today, titrate meds accordingly.    Relevant Orders      Hemoglobin A1c      Comprehensive metabolic panel      Microalbumin / creatinine urine ratio       Follow up plan: Return in about 5 months (around 10/27/2013), or as needed, for follow up.

## 2013-05-27 NOTE — Assessment & Plan Note (Signed)
Preventative protocols reviewed and updated unless pt declined. Discussed healthy diet and lifestyle.  

## 2013-05-27 NOTE — Progress Notes (Signed)
Pre visit review using our clinic review tool, if applicable. No additional management support is needed unless otherwise documented below in the visit note. 

## 2013-05-27 NOTE — Assessment & Plan Note (Signed)
Recheck today. 

## 2013-05-28 ENCOUNTER — Other Ambulatory Visit: Payer: Self-pay | Admitting: Family Medicine

## 2013-05-28 DIAGNOSIS — R740 Nonspecific elevation of levels of transaminase and lactic acid dehydrogenase [LDH]: Principal | ICD-10-CM

## 2013-05-28 DIAGNOSIS — R7401 Elevation of levels of liver transaminase levels: Secondary | ICD-10-CM

## 2013-05-28 MED ORDER — FISH OIL 1200 MG PO CAPS: 2.0000 | ORAL_CAPSULE | Freq: Every day | ORAL | Status: DC

## 2013-05-28 MED ORDER — SITAGLIPTIN PHOSPHATE 100 MG PO TABS: 100.0000 mg | ORAL_TABLET | Freq: Every day | ORAL | Status: DC

## 2013-05-31 ENCOUNTER — Telehealth: Payer: Self-pay

## 2013-05-31 NOTE — Telephone Encounter (Signed)
Relevant patient education assigned to patient using Emmi. ° °

## 2013-06-07 ENCOUNTER — Encounter: Payer: Self-pay | Admitting: Family Medicine

## 2013-06-07 ENCOUNTER — Ambulatory Visit: Payer: Self-pay | Admitting: Family Medicine

## 2013-06-09 ENCOUNTER — Encounter: Payer: Self-pay | Admitting: Family Medicine

## 2013-08-31 ENCOUNTER — Ambulatory Visit: Payer: BC Managed Care – PPO | Admitting: Family Medicine

## 2013-09-21 LAB — HM DIABETES EYE EXAM

## 2013-10-28 ENCOUNTER — Ambulatory Visit: Payer: BC Managed Care – PPO | Admitting: Family Medicine

## 2013-10-31 ENCOUNTER — Ambulatory Visit (INDEPENDENT_AMBULATORY_CARE_PROVIDER_SITE_OTHER): Payer: BC Managed Care – PPO | Admitting: Family Medicine

## 2013-10-31 ENCOUNTER — Encounter: Payer: Self-pay | Admitting: Family Medicine

## 2013-10-31 VITALS — BP 118/76 | HR 84 | Temp 98.3°F | Wt 213.2 lb

## 2013-10-31 DIAGNOSIS — E1140 Type 2 diabetes mellitus with diabetic neuropathy, unspecified: Secondary | ICD-10-CM

## 2013-10-31 DIAGNOSIS — R7401 Elevation of levels of liver transaminase levels: Secondary | ICD-10-CM

## 2013-10-31 DIAGNOSIS — E1165 Type 2 diabetes mellitus with hyperglycemia: Principal | ICD-10-CM

## 2013-10-31 DIAGNOSIS — R7402 Elevation of levels of lactic acid dehydrogenase (LDH): Secondary | ICD-10-CM

## 2013-10-31 DIAGNOSIS — E1149 Type 2 diabetes mellitus with other diabetic neurological complication: Secondary | ICD-10-CM

## 2013-10-31 DIAGNOSIS — R740 Nonspecific elevation of levels of transaminase and lactic acid dehydrogenase [LDH]: Secondary | ICD-10-CM

## 2013-10-31 DIAGNOSIS — E1142 Type 2 diabetes mellitus with diabetic polyneuropathy: Secondary | ICD-10-CM

## 2013-10-31 DIAGNOSIS — E781 Pure hyperglyceridemia: Secondary | ICD-10-CM

## 2013-10-31 DIAGNOSIS — IMO0002 Reserved for concepts with insufficient information to code with codable children: Secondary | ICD-10-CM

## 2013-10-31 DIAGNOSIS — K824 Cholesterolosis of gallbladder: Secondary | ICD-10-CM | POA: Insufficient documentation

## 2013-10-31 LAB — HEPATIC FUNCTION PANEL
ALT: 88 U/L — ABNORMAL HIGH (ref 0–53)
AST: 58 U/L — ABNORMAL HIGH (ref 0–37)
Albumin: 4.4 g/dL (ref 3.5–5.2)
Alkaline Phosphatase: 47 U/L (ref 39–117)
Bilirubin, Direct: 0 mg/dL (ref 0.0–0.3)
Total Bilirubin: 0.5 mg/dL (ref 0.2–1.2)
Total Protein: 7.9 g/dL (ref 6.0–8.3)

## 2013-10-31 LAB — IBC PANEL
Iron: 74 ug/dL (ref 42–165)
Saturation Ratios: 17.9 % — ABNORMAL LOW (ref 20.0–50.0)
Transferrin: 295 mg/dL (ref 212.0–360.0)

## 2013-10-31 LAB — LIPID PANEL
Cholesterol: 217 mg/dL — ABNORMAL HIGH (ref 0–200)
HDL: 25 mg/dL — ABNORMAL LOW (ref >39.00)
NonHDL: 192
Total CHOL/HDL Ratio: 9
Triglycerides: 428 mg/dL — ABNORMAL HIGH (ref 0.0–149.0)
VLDL: 85.6 mg/dL — ABNORMAL HIGH (ref 0.0–40.0)

## 2013-10-31 LAB — HEMOGLOBIN A1C: Hgb A1c MFr Bld: 9.3 % — ABNORMAL HIGH (ref 4.6–6.5)

## 2013-10-31 LAB — LDL CHOLESTEROL, DIRECT: Direct LDL: 146 mg/dL

## 2013-10-31 MED ORDER — GLUCOSE BLOOD VI STRP: ORAL_STRIP | Status: DC

## 2013-10-31 NOTE — Patient Instructions (Signed)
Pass by Marion's office today to schedule repeat ultrasound (or she will call your wife to schedule). Blood work today. Return at your convenience for skin tag removal. Good to see you today, call us with quesitons. Return in 6 months for follow up visit

## 2013-10-31 NOTE — Assessment & Plan Note (Signed)
Dyslipidemia predominantly hypertriglyceridemia now on tricor. Recheck FLP and if LDL above goal of <100, consider starting statin. Pt never started fish oil. Endorses healthy low chol diet.

## 2013-10-31 NOTE — Progress Notes (Signed)
Pre visit review using our clinic review tool, if applicable. No additional management support is needed unless otherwise documented below in the visit note. 

## 2013-10-31 NOTE — Assessment & Plan Note (Signed)
I have asked him to pass by Marion's office to schedule rpt [place] to f/u gallbladder polyps found on last [place].

## 2013-10-31 NOTE — Assessment & Plan Note (Signed)
Check A1c today. Encouraged he start checking cbg's at home. Refilled generic testing strips today. Reviewed with patient and wife how to obtain glucose meter and strips. Reviewed different options. Pt prefers to avoid any injectable medications.

## 2013-10-31 NOTE — Assessment & Plan Note (Signed)
Check LFTs today.

## 2013-10-31 NOTE — Progress Notes (Signed)
BP 118/76  Pulse 84  Temp(Src) 98.3 F (36.8 C) (Oral)  Wt 213 lb 4 oz (96.73 kg)   CC: 6 mo f/u  Subjective:    Patient ID: Todd Braun, male    DOB: 01/20/1976, 38 y.o.   MRN: [id number]  HPI: Todd Braun is a 38 y.o. male presenting on 10/31/2013 for Follow-up   Wt Readings from Last 3 Encounters:  10/31/13 213 lb 4 oz (96.73 kg)  05/27/13 212 lb (96.163 kg)  02/23/13 213 lb (96.616 kg)   Body mass index is 38.99 kg/(m^2).  DM - regularly does not check sugars.  Compliant with antihyperglycemic regimen which includes: januvia 163m daily.  Denies low sugars or hypoglycemic symptoms.  Occasional paresthesias when on feet significant amt time. Last diabetic eye exam 08/2013. Pneumovax: declines for now. Declines DMSE referral  HLD - changing diet - more fish, more salads.   Gallbladder polyps with fatty liver - due for rpt abd [place]   Relevant past medical, surgical, family and social history reviewed and updated as indicated.  Allergies and medications reviewed and updated. Current Outpatient Prescriptions on File Prior to Visit  Medication Sig  . Blood Glucose Monitoring Suppl (BLOOD GLUCOSE METER) kit Use as instructed  . etodolac (LODINE) 500 MG tablet Take 500 mg by mouth 2 (two) times daily as needed.  . fenofibrate (TRICOR) 145 MG tablet Take 1 tablet (145 mg total) by mouth daily.  . Lancets 30G MISC 1 each by Does not apply route daily.  . sitaGLIPtin (JANUVIA) 100 MG tablet Take 1 tablet (100 mg total) by mouth daily.  . traMADol (ULTRAM) 50 MG tablet Take 1 tablet (50 mg total) by mouth 2 (two) times daily as needed.   No current facility-administered medications on file prior to visit.    Review of Systems Per HPI unless specifically indicated above    Objective:    BP 118/76  Pulse 84  Temp(Src) 98.3 F (36.8 C) (Oral)  Wt 213 lb 4 oz (96.73 kg)  Physical Exam  Nursing note and vitals reviewed. Constitutional: He appears well-developed and well-nourished. No  distress.  HENT:  Mouth/Throat: Oropharynx is clear and moist. No oropharyngeal exudate.  Cardiovascular: Normal rate, regular rhythm, normal heart sounds and intact distal pulses.   No murmur heard. Pulmonary/Chest: Effort normal and breath sounds normal. No respiratory distress. He has no wheezes. He has no rales.  Musculoskeletal: He exhibits no edema.  Skin: Skin is warm and dry.  Innumerable skin tags bilateral axill  Psychiatric: He has a normal mood and affect.   Results for orders placed in visit on 10/31/13  HM DIABETES EYE EXAM      Result Value Ref Range   HM Diabetic Eye Exam No Retinopathy  No Retinopathy      Assessment & Plan:   Problem List Items Addressed This Visit   Uncontrolled type 2 diabetes with neuropathy - Primary     Check A1c today. Encouraged he start checking cbg's at home. Refilled generic testing strips today. Reviewed with patient and wife how to obtain glucose meter and strips. Reviewed different options. Pt prefers to avoid any injectable medications.    Relevant Orders      Hemoglobin A1c   Hypertriglyceridemia     Dyslipidemia predominantly hypertriglyceridemia now on tricor. Recheck FLP and if LDL above goal of <100, consider starting statin. Pt never started fish oil. Endorses healthy low chol diet.    Relevant Orders  Lipid panel   Gallbladder polyp     I have asked him to pass by Marion's office to schedule rpt [place] to f/u gallbladder polyps found on last [place].    Fatty liver     Check LFTs today.        Follow up plan: Return in about 6 months (around 05/03/2014), or as needed, for followup .

## 2013-11-01 LAB — HEPATITIS PANEL, ACUTE
HCV Ab: NEGATIVE
Hep A IgM: NONREACTIVE
Hep B C IgM: NONREACTIVE
Hepatitis B Surface Ag: NEGATIVE

## 2013-11-02 ENCOUNTER — Encounter: Payer: Self-pay | Admitting: Family Medicine

## 2013-11-03 ENCOUNTER — Other Ambulatory Visit: Payer: Self-pay | Admitting: Family Medicine

## 2013-11-03 MED ORDER — ATORVASTATIN CALCIUM 40 MG PO TABS
40.0000 mg | ORAL_TABLET | Freq: Every day | ORAL | Status: DC
Start: 2013-11-03 — End: 2013-12-17

## 2013-11-03 MED ORDER — GLIMEPIRIDE 2 MG PO TABS: 2.0000 mg | ORAL_TABLET | Freq: Every day | ORAL | Status: DC

## 2013-11-07 ENCOUNTER — Other Ambulatory Visit: Payer: Self-pay | Admitting: Family Medicine

## 2013-11-07 DIAGNOSIS — K760 Fatty (change of) liver, not elsewhere classified: Secondary | ICD-10-CM

## 2013-11-07 DIAGNOSIS — K824 Cholesterolosis of gallbladder: Secondary | ICD-10-CM

## 2013-11-14 ENCOUNTER — Ambulatory Visit: Payer: Self-pay | Admitting: Family Medicine

## 2013-11-14 ENCOUNTER — Encounter: Payer: Self-pay | Admitting: Family Medicine

## 2013-11-14 ENCOUNTER — Ambulatory Visit (INDEPENDENT_AMBULATORY_CARE_PROVIDER_SITE_OTHER): Payer: BC Managed Care – PPO | Admitting: Family Medicine

## 2013-11-14 VITALS — BP 118/84 | HR 68 | Temp 98.1°F | Wt 212.8 lb

## 2013-11-14 DIAGNOSIS — L918 Other hypertrophic disorders of the skin: Secondary | ICD-10-CM

## 2013-11-14 DIAGNOSIS — L919 Hypertrophic disorder of the skin, unspecified: Secondary | ICD-10-CM

## 2013-11-14 DIAGNOSIS — L909 Atrophic disorder of skin, unspecified: Secondary | ICD-10-CM

## 2013-11-14 MED ORDER — TRAMADOL HCL 50 MG PO TABS: 50.0000 mg | ORAL_TABLET | Freq: Two times a day (BID) | ORAL | Status: DC | PRN

## 2013-11-14 NOTE — Addendum Note (Signed)
Addended by: Royann Shivers A on: 11/14/2013 02:04 PM   Modules accepted: Orders

## 2013-11-14 NOTE — Progress Notes (Signed)
S: The patient complains of symptomatic skin tags on bilateral axilla. These are irritated by clothing, jewelry and rubbing.  O: Obese patient appears well. Innumerable benign skin tags are noted on bilateral axilla. Some skin thickening also noted bilateral axilla.   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  P: Most bothersome skin tags #15 are snipped off using EtOH for cleansing and sterile iris scissors. Local anesthesia was used. Silver nitrate was used. Dressed with abx ointment and bandaids. 2 lesions sent to pathology. Post procedure wound care discussed

## 2013-11-14 NOTE — Progress Notes (Signed)
Pre visit review using our clinic review tool, if applicable. No additional management support is needed unless otherwise documented below in the visit note. 

## 2013-11-14 NOTE — Patient Instructions (Addendum)
Skin tags removed today #15. Wound care - bandage changes daily with antibiotic ointment. Wash with soapy water, pat dry. Please return if streaking redness or other signs of infection. Good to see you today, call us with quesitons. May try tag away, return here for continued removal or skin doctor referral

## 2013-11-18 ENCOUNTER — Telehealth: Payer: Self-pay | Admitting: Family Medicine

## 2013-11-18 NOTE — Telephone Encounter (Signed)
plz notify abd [place] returned ok. Stable gallbladder polyps without change. Consider rpt in 1 yr.

## 2013-11-21 NOTE — Telephone Encounter (Signed)
Patient notified

## 2013-11-28 ENCOUNTER — Encounter: Payer: Self-pay | Admitting: Family Medicine

## 2013-11-29 ENCOUNTER — Encounter: Payer: Self-pay | Admitting: Family Medicine

## 2013-12-05 ENCOUNTER — Encounter: Payer: Self-pay | Admitting: *Deleted

## 2013-12-13 ENCOUNTER — Other Ambulatory Visit: Payer: Self-pay | Admitting: Family Medicine

## 2013-12-16 ENCOUNTER — Other Ambulatory Visit: Payer: Self-pay

## 2013-12-16 ENCOUNTER — Encounter: Payer: Self-pay | Admitting: Family Medicine

## 2013-12-16 MED ORDER — GLIMEPIRIDE 2 MG PO TABS: 2.0000 mg | ORAL_TABLET | Freq: Every day | ORAL | Status: DC

## 2013-12-16 NOTE — Telephone Encounter (Signed)
Patient requested via MyChart a 90 day supply be [facility] in [facility]

## 2013-12-17 MED ORDER — ATORVASTATIN CALCIUM 40 MG PO TABS: 40.0000 mg | ORAL_TABLET | Freq: Every day | ORAL | Status: DC

## 2013-12-20 MED ORDER — GLUCOSE BLOOD VI STRP: ORAL_STRIP | Status: DC

## 2013-12-20 NOTE — Addendum Note (Signed)
Addended by: Ria Bush on: 12/20/2013 11:58 AM   Modules accepted: Orders, Medications

## 2014-01-02 ENCOUNTER — Other Ambulatory Visit: Payer: Self-pay | Admitting: Family Medicine

## 2014-01-30 ENCOUNTER — Encounter: Payer: Self-pay | Admitting: Family Medicine

## 2014-01-30 ENCOUNTER — Other Ambulatory Visit: Payer: Self-pay | Admitting: Family Medicine

## 2014-01-30 MED ORDER — BLOOD GLUCOSE METER KIT: PACK | Status: AC

## 2014-01-30 MED ORDER — GLIMEPIRIDE 2 MG PO TABS: 2.0000 mg | ORAL_TABLET | Freq: Every day | ORAL | Status: DC

## 2014-05-03 ENCOUNTER — Ambulatory Visit: Payer: BC Managed Care – PPO | Admitting: Family Medicine

## 2014-05-04 ENCOUNTER — Ambulatory Visit: Payer: BC Managed Care – PPO | Admitting: Family Medicine

## 2014-05-25 ENCOUNTER — Other Ambulatory Visit: Payer: Self-pay | Admitting: Family Medicine

## 2014-07-19 ENCOUNTER — Ambulatory Visit (INDEPENDENT_AMBULATORY_CARE_PROVIDER_SITE_OTHER): Payer: BLUE CROSS/BLUE SHIELD | Admitting: Family Medicine

## 2014-07-19 ENCOUNTER — Encounter: Payer: Self-pay | Admitting: Family Medicine

## 2014-07-19 VITALS — BP 104/70 | HR 84 | Temp 98.1°F | Ht 62.75 in | Wt 213.0 lb

## 2014-07-19 DIAGNOSIS — E1165 Type 2 diabetes mellitus with hyperglycemia: Secondary | ICD-10-CM

## 2014-07-19 DIAGNOSIS — Z0000 Encounter for general adult medical examination without abnormal findings: Secondary | ICD-10-CM

## 2014-07-19 DIAGNOSIS — E1140 Type 2 diabetes mellitus with diabetic neuropathy, unspecified: Secondary | ICD-10-CM | POA: Diagnosis not present

## 2014-07-19 DIAGNOSIS — M109 Gout, unspecified: Secondary | ICD-10-CM | POA: Diagnosis not present

## 2014-07-19 DIAGNOSIS — E781 Pure hyperglyceridemia: Secondary | ICD-10-CM

## 2014-07-19 DIAGNOSIS — IMO0002 Reserved for concepts with insufficient information to code with codable children: Secondary | ICD-10-CM

## 2014-07-19 DIAGNOSIS — K760 Fatty (change of) liver, not elsewhere classified: Secondary | ICD-10-CM | POA: Diagnosis not present

## 2014-07-19 DIAGNOSIS — K824 Cholesterolosis of gallbladder: Secondary | ICD-10-CM

## 2014-07-19 DIAGNOSIS — E669 Obesity, unspecified: Secondary | ICD-10-CM | POA: Diagnosis not present

## 2014-07-19 LAB — COMPREHENSIVE METABOLIC PANEL
ALT: 75 U/L — ABNORMAL HIGH (ref 0–53)
AST: 42 U/L — ABNORMAL HIGH (ref 0–37)
Albumin: 4.6 g/dL (ref 3.5–5.2)
Alkaline Phosphatase: 43 U/L (ref 39–117)
BUN: 15 mg/dL (ref 6–23)
CO2: 29 mEq/L (ref 19–32)
Calcium: 9.9 mg/dL (ref 8.4–10.5)
Chloride: 102 mEq/L (ref 96–112)
Creatinine, Ser: 1.01 mg/dL (ref 0.40–1.50)
GFR: 88.02 mL/min (ref >60.00)
Glucose, Bld: 141 mg/dL — ABNORMAL HIGH (ref 70–99)
Potassium: 4.1 mEq/L (ref 3.5–5.1)
Sodium: 137 mEq/L (ref 135–145)
Total Bilirubin: 0.6 mg/dL (ref 0.2–1.2)
Total Protein: 7.8 g/dL (ref 6.0–8.3)

## 2014-07-19 LAB — HEMOGLOBIN A1C: Hgb A1c MFr Bld: 9.2 % — ABNORMAL HIGH (ref 4.6–6.5)

## 2014-07-19 LAB — LIPID PANEL
Cholesterol: 172 mg/dL (ref 0–200)
HDL: 33.4 mg/dL — ABNORMAL LOW (ref >39.00)
LDL Cholesterol: 107 mg/dL — ABNORMAL HIGH (ref 0–99)
NonHDL: 138.6
Total CHOL/HDL Ratio: 5
Triglycerides: 156 mg/dL — ABNORMAL HIGH (ref 0.0–149.0)
VLDL: 31.2 mg/dL (ref 0.0–40.0)

## 2014-07-19 LAB — URIC ACID: Uric Acid, Serum: 7.7 mg/dL (ref 4.0–7.8)

## 2014-07-19 LAB — MICROALBUMIN / CREATININE URINE RATIO
Creatinine,U: 129.8 mg/dL
Microalb Creat Ratio: 0.5 mg/g (ref 0.0–30.0)
Microalb, Ur: <0.7 mg/dL (ref 0.0–1.9)

## 2014-07-19 LAB — TSH: TSH: 2.07 u[IU]/mL (ref 0.35–4.50)

## 2014-07-19 NOTE — Assessment & Plan Note (Signed)
Preventative protocols reviewed and updated unless pt declined. Discussed healthy diet and lifestyle.  

## 2014-07-19 NOTE — Assessment & Plan Note (Signed)
No recent flare. Check UA today.

## 2014-07-19 NOTE — Patient Instructions (Addendum)
Let's check blood work then decide on medicines for diabetes. Continue cholesterol medicines. Return as needed or in 6 months for diabetes follow up

## 2014-07-19 NOTE — Assessment & Plan Note (Signed)
Compliant with statin and fibrate - check FLP today. Anticipate good control.

## 2014-07-19 NOTE — Assessment & Plan Note (Signed)
Check A1c today. Pt interested in change from janvuia to metformin ER - will await labs. Continue amaryl 81m daily. Discussed DSME referral, pt declines for now.

## 2014-07-19 NOTE — Assessment & Plan Note (Signed)
Discussed healthy diet and lifestyle changes to affect sustainable weight loss  

## 2014-07-19 NOTE — Assessment & Plan Note (Signed)
Known by [place].

## 2014-07-19 NOTE — Progress Notes (Signed)
BP 104/70 mmHg  Pulse 84  Temp(Src) 98.1 F (36.7 C) (Oral)  Ht 5' 2.75" (1.594 m)  Wt 213 lb (96.616 kg)  BMI 38.03 kg/m2   CC: CPE  Subjective:    Patient ID: Todd Braun, male    DOB: 1975-05-28, 39 y.o.   MRN: [id number]  HPI: Todd Braun is a 39 y.o. male presenting on 07/19/2014 for Annual Exam   DM - not happy with januvia 194m - worried it may be causing weight gain but reviewing weights actually stable. Fasting sugar ~120-150. Also taking glimepiride 267min am - does not take with food. Meal regimen irregular - meals at noon then 7pm then 2am due to work schedule. Has not attended DSME.   Preventative:  Flu shot did not receive last year Tdap 2013 Pneumonia shot - declines Seat belt use discussed Sunscreen use discussed. No changing moles on skin.  Caffeine: 3-4 cups/night coffee Lives with wife, son (2005) and twins (2012), 1 outside cat Occupation: toolmaker Edu: HS Activity: walking twice weekly for 30 min Diet: good water, fruits/vegetables occasionally  Relevant past medical, surgical, family and social history reviewed and updated as indicated. Interim medical history since our last visit reviewed. Allergies and medications reviewed and updated. Current Outpatient Prescriptions on File Prior to Visit  Medication Sig  . atorvastatin (LIPITOR) 40 MG tablet Take 1 tablet (40 mg total) by mouth daily.  . Blood Glucose Monitoring Suppl (BLOOD GLUCOSE METER KIT AND SUPPLIES) Use as instructed  . fenofibrate (TRICOR) 145 MG tablet TAKE 1 TABLET DAILY  . glimepiride (AMARYL) 2 MG tablet Take 1 tablet (2 mg total) by mouth daily before breakfast.  . glucose blood test strip Check 1-2 times daily and PRN 250.02. Strips compatible with onetouch ultra meter.  . Lancets 30G MISC 1 each by Does not apply route daily.  . sitaGLIPtin (JANUVIA) 100 MG tablet Take one tablet daily. **MUST HAVE FOLLOW UP FOR FURTHER REFILLS**  . etodolac (LODINE) 500 MG tablet Take 500 mg by  mouth 2 (two) times daily as needed.  . traMADol (ULTRAM) 50 MG tablet Take 1 tablet (50 mg total) by mouth 2 (two) times daily as needed. (Patient not taking: Reported on 07/19/2014)   No current facility-administered medications on file prior to visit.    Review of Systems  Constitutional: Negative for fever, chills, activity change, appetite change, fatigue and unexpected weight change.  HENT: Negative for hearing loss.   Eyes: Negative for visual disturbance.  Respiratory: Negative for cough, chest tightness, shortness of breath and wheezing.   Cardiovascular: Negative for chest pain, palpitations and leg swelling.  Gastrointestinal: Negative for nausea, vomiting, abdominal pain, diarrhea, constipation, blood in stool and abdominal distention.  Genitourinary: Negative for hematuria and difficulty urinating.  Musculoskeletal: Negative for myalgias, arthralgias and neck pain.  Skin: Negative for rash.  Neurological: Negative for dizziness, seizures, syncope and headaches.  Hematological: Negative for adenopathy. Does not bruise/bleed easily.  Psychiatric/Behavioral: Negative for dysphoric mood. The patient is not nervous/anxious.    Per HPI unless specifically indicated above     Objective:    BP 104/70 mmHg  Pulse 84  Temp(Src) 98.1 F (36.7 C) (Oral)  Ht 5' 2.75" (1.594 m)  Wt 213 lb (96.616 kg)  BMI 38.03 kg/m2  Wt Readings from Last 3 Encounters:  07/19/14 213 lb (96.616 kg)  11/14/13 212 lb 12 oz (96.503 kg)  10/31/13 213 lb 4 oz (96.73 kg)    Physical Exam  Constitutional: He  is oriented to person, place, and time. He appears well-developed and well-nourished. No distress.  HENT:  Head: Normocephalic and atraumatic.  Right Ear: Hearing, tympanic membrane, external ear and ear canal normal.  Left Ear: Hearing, tympanic membrane, external ear and ear canal normal.  Nose: Nose normal.  Mouth/Throat: Uvula is midline, oropharynx is clear and moist and mucous membranes  are normal. No oropharyngeal exudate, posterior oropharyngeal edema or posterior oropharyngeal erythema.  Eyes: Conjunctivae and EOM are normal. Pupils are equal, round, and reactive to light. No scleral icterus.  Neck: Normal range of motion. Neck supple. No thyromegaly present.  Cardiovascular: Normal rate, regular rhythm, normal heart sounds and intact distal pulses.   No murmur heard. Pulses:      Radial pulses are 2+ on the right side, and 2+ on the left side.  Pulmonary/Chest: Effort normal and breath sounds normal. No respiratory distress. He has no wheezes. He has no rales.  Abdominal: Soft. Bowel sounds are normal. He exhibits no distension and no mass. There is no tenderness. There is no rebound and no guarding.  Musculoskeletal: Normal range of motion. He exhibits no edema.  Lymphadenopathy:    He has no cervical adenopathy.  Neurological: He is alert and oriented to person, place, and time.  CN grossly intact, station and gait intact  Skin: Skin is warm and dry. No rash noted.  Skin tags bilateral axillae  Psychiatric: He has a normal mood and affect. His behavior is normal. Judgment and thought content normal.  Nursing note and vitals reviewed.     Assessment & Plan:   Problem List Items Addressed This Visit    Uncontrolled type 2 diabetes with neuropathy    Check A1c today. Pt interested in change from janvuia to metformin ER - will await labs. Continue amaryl 39m daily. Discussed DSME referral, pt declines for now.      Relevant Orders   Hemoglobin A1c   Microalbumin / creatinine urine ratio   Obesity    Discussed healthy diet and lifestyle changes to affect sustainable weight loss.      Relevant Orders   TSH   Hypertriglyceridemia    Compliant with statin and fibrate - check FLP today. Anticipate good control.      Relevant Orders   Lipid panel   Comprehensive metabolic panel   Health maintenance examination - Primary    Preventative protocols reviewed  and updated unless pt declined. Discussed healthy diet and lifestyle.       Gout    No recent flare. Check UA today.      Relevant Orders   Uric acid   Gallbladder polyp    Consider f/u abd [place] to document stability of gallbladder polyps      Fatty liver    Known by [place]      Relevant Orders   Comprehensive metabolic panel       Follow up plan: Return in about 6 months (around 01/18/2015), or as needed, for follow up visit.

## 2014-07-19 NOTE — Assessment & Plan Note (Signed)
Consider f/u abd [place] after 10/2014 to document stability of gallbladder polyps

## 2014-07-19 NOTE — Progress Notes (Signed)
Pre visit review using our clinic review tool, if applicable. No additional management support is needed unless otherwise documented below in the visit note. 

## 2014-07-20 ENCOUNTER — Telehealth: Payer: Self-pay | Admitting: Family Medicine

## 2014-07-22 ENCOUNTER — Other Ambulatory Visit: Payer: Self-pay | Admitting: Family Medicine

## 2014-07-22 MED ORDER — GLIMEPIRIDE 4 MG PO TABS: 4.0000 mg | ORAL_TABLET | Freq: Every day | ORAL | Status: DC

## 2014-07-22 MED ORDER — METFORMIN HCL ER (OSM) 1000 MG PO TB24: 1000.0000 mg | ORAL_TABLET | Freq: Every day | ORAL | Status: DC

## 2014-07-26 ENCOUNTER — Encounter: Payer: Self-pay | Admitting: *Deleted

## 2014-08-22 ENCOUNTER — Other Ambulatory Visit: Payer: Self-pay | Admitting: Family Medicine

## 2014-09-11 ENCOUNTER — Encounter: Payer: Self-pay | Admitting: Family Medicine

## 2014-09-11 MED ORDER — METFORMIN HCL ER (OSM) 1000 MG PO TB24: 1000.0000 mg | ORAL_TABLET | Freq: Every day | ORAL | Status: DC

## 2014-10-19 ENCOUNTER — Other Ambulatory Visit: Payer: Self-pay | Admitting: *Deleted

## 2014-10-19 MED ORDER — GLIMEPIRIDE 4 MG PO TABS: 4.0000 mg | ORAL_TABLET | Freq: Every day | ORAL | Status: DC

## 2014-11-03 ENCOUNTER — Other Ambulatory Visit: Payer: Self-pay | Admitting: Family Medicine

## 2014-12-07 LAB — HM DIABETES EYE EXAM

## 2014-12-28 ENCOUNTER — Encounter: Payer: Self-pay | Admitting: Family Medicine

## 2014-12-28 NOTE — Telephone Encounter (Signed)
Please see Mychart message from patient.

## 2014-12-31 MED ORDER — CANAGLIFLOZIN 100 MG PO TABS: 100.0000 mg | ORAL_TABLET | Freq: Every day | ORAL | Status: DC

## 2015-01-23 ENCOUNTER — Ambulatory Visit (INDEPENDENT_AMBULATORY_CARE_PROVIDER_SITE_OTHER): Payer: BLUE CROSS/BLUE SHIELD | Admitting: Family Medicine

## 2015-01-23 ENCOUNTER — Encounter: Payer: Self-pay | Admitting: Family Medicine

## 2015-01-23 VITALS — BP 112/72 | HR 88 | Temp 98.0°F | Wt 214.5 lb

## 2015-01-23 DIAGNOSIS — IMO0001 Reserved for inherently not codable concepts without codable children: Secondary | ICD-10-CM

## 2015-01-23 DIAGNOSIS — K760 Fatty (change of) liver, not elsewhere classified: Secondary | ICD-10-CM

## 2015-01-23 DIAGNOSIS — M109 Gout, unspecified: Secondary | ICD-10-CM

## 2015-01-23 DIAGNOSIS — E781 Pure hyperglyceridemia: Secondary | ICD-10-CM | POA: Diagnosis not present

## 2015-01-23 DIAGNOSIS — IMO0002 Reserved for concepts with insufficient information to code with codable children: Secondary | ICD-10-CM

## 2015-01-23 DIAGNOSIS — E1140 Type 2 diabetes mellitus with diabetic neuropathy, unspecified: Secondary | ICD-10-CM

## 2015-01-23 DIAGNOSIS — E1165 Type 2 diabetes mellitus with hyperglycemia: Secondary | ICD-10-CM | POA: Diagnosis not present

## 2015-01-23 DIAGNOSIS — K824 Cholesterolosis of gallbladder: Secondary | ICD-10-CM

## 2015-01-23 LAB — COMPREHENSIVE METABOLIC PANEL
ALT: 75 U/L — ABNORMAL HIGH (ref 0–53)
AST: 37 U/L (ref 0–37)
Albumin: 4.5 g/dL (ref 3.5–5.2)
Alkaline Phosphatase: 62 U/L (ref 39–117)
BUN: 17 mg/dL (ref 6–23)
CO2: 27 mEq/L (ref 19–32)
Calcium: 10.1 mg/dL (ref 8.4–10.5)
Chloride: 103 mEq/L (ref 96–112)
Creatinine, Ser: 1.12 mg/dL (ref 0.40–1.50)
GFR: 77.91 mL/min (ref >60.00)
Glucose, Bld: 149 mg/dL — ABNORMAL HIGH (ref 70–99)
Potassium: 4.3 mEq/L (ref 3.5–5.1)
Sodium: 141 mEq/L (ref 135–145)
Total Bilirubin: 0.4 mg/dL (ref 0.2–1.2)
Total Protein: 8.1 g/dL (ref 6.0–8.3)

## 2015-01-23 LAB — LIPID PANEL
Cholesterol: 137 mg/dL (ref 0–200)
HDL: 24.8 mg/dL — ABNORMAL LOW (ref >39.00)
NonHDL: 112.65
Total CHOL/HDL Ratio: 6
Triglycerides: 232 mg/dL — ABNORMAL HIGH (ref 0.0–149.0)
VLDL: 46.4 mg/dL — ABNORMAL HIGH (ref 0.0–40.0)

## 2015-01-23 LAB — LDL CHOLESTEROL, DIRECT: Direct LDL: 76 mg/dL

## 2015-01-23 LAB — HEMOGLOBIN A1C: Hgb A1c MFr Bld: 8.1 % — ABNORMAL HIGH (ref 4.6–6.5)

## 2015-01-23 MED ORDER — GLUCOSE BLOOD VI STRP: ORAL_STRIP | Status: DC

## 2015-01-23 NOTE — Progress Notes (Signed)
Pre visit review using our clinic review tool, if applicable. No additional management support is needed unless otherwise documented below in the visit note. 

## 2015-01-23 NOTE — Progress Notes (Signed)
BP 112/72 mmHg  Pulse 88  Temp(Src) 98 F (36.7 C) (Oral)  Wt 214 lb 8 oz (97.297 kg)   CC: 79mof/u visit  Subjective:    Patient ID: Todd Braun male    DOB: 8May 09, 1977 39y.o.   MRN: [id number] HPI: Todd Fleigis a 39y.o. male presenting on 01/23/2015 for Follow-up   DM - regularly does check sugars 115-120. Compliant with antihyperglycemic regimen which includes: amaryl 449mdaily, invokana 10082maily. Denies UTI/yeast infection sxs. Denies low sugars or hypoglycemic symptoms. Did ont tolerate extended release metformin - GI upset even had to go home from work 2/2 diarrhea. Denies paresthesias. Last diabetic eye exam 9 15 16  at [facility].  Pneumovax: Declines.  Prevnar: not due. Lab Results  Component Value Date   HGBA1C 9.2* 07/19/2014   Diabetic Foot Exam - Simple   Simple Foot Form  Diabetic Foot exam was performed with the following findings:  Yes 01/23/2015  9:30 AM  Visual Inspection  No deformities, no ulcerations, no other skin breakdown bilaterally:  Yes  Sensation Testing  Intact to touch and monofilament testing bilaterally:  Yes  Pulse Check  Posterior Tibialis and Dorsalis pulse intact bilaterally:  Yes  Comments       HLD - not constitently taking lipitor or tricor. Wanted to come off these meds. Taking about 3-4 times weekly. Never had myalgia.  Relevant past medical, surgical, family and social history reviewed and updated as indicated. Interim medical history since our last visit reviewed. Allergies and medications reviewed and updated. Current Outpatient Prescriptions on File Prior to Visit  Medication Sig  . atorvastatin (LIPITOR) 40 MG tablet Take 1 tablet (40 mg total) by mouth daily.  . Blood Glucose Monitoring Suppl (BLOOD GLUCOSE METER KIT AND SUPPLIES) Use as instructed  . canagliflozin (INVOKANA) 100 MG TABS tablet Take 1 tablet (100 mg total) by mouth daily.  . fenofibrate (TRICOR) 145 MG tablet TAKE 1 TABLET DAILY  . glimepiride (AMARYL) 4  MG tablet Take 1 tablet (4 mg total) by mouth daily before breakfast.  . glucose blood test strip Check 1-2 times daily and PRN 250.02. Strips compatible with onetouch ultra meter.  . Lancets 30G MISC 1 each by Does not apply route daily.  . eMarland Kitchenodolac (LODINE) 500 MG tablet Take 500 mg by mouth 2 (two) times daily as needed.  . traMADol (ULTRAM) 50 MG tablet Take 1 tablet (50 mg total) by mouth 2 (two) times daily as needed. (Patient not taking: Reported on 07/19/2014)   No current facility-administered medications on file prior to visit.    Review of Systems Per HPI unless specifically indicated in ROS section     Objective:    BP 112/72 mmHg  Pulse 88  Temp(Src) 98 F (36.7 C) (Oral)  Wt 214 lb 8 oz (97.297 kg)  Wt Readings from Last 3 Encounters:  01/23/15 214 lb 8 oz (97.297 kg)  07/19/14 213 lb (96.616 kg)  11/14/13 212 lb 12 oz (96.503 kg)   Body mass index is 38.29 kg/(m^2).  Physical Exam  Constitutional: He appears well-developed and well-nourished. No distress.  HENT:  Head: Normocephalic and atraumatic.  Right Ear: External ear normal.  Left Ear: External ear normal.  Nose: Nose normal.  Mouth/Throat: Oropharynx is clear and moist. No oropharyngeal exudate.  Eyes: Conjunctivae and EOM are normal. Pupils are equal, round, and reactive to light. No scleral icterus.  Neck: Normal range of motion. Neck supple.  Cardiovascular: Normal  rate, regular rhythm, normal heart sounds and intact distal pulses.   No murmur heard. Pulmonary/Chest: Effort normal and breath sounds normal. No respiratory distress. He has no wheezes. He has no rales.  Musculoskeletal: He exhibits no edema.  See HPI for foot exam if done  Lymphadenopathy:    He has no cervical adenopathy.  Skin: Skin is warm and dry. No rash noted.  Psychiatric: He has a normal mood and affect.  Nursing note and vitals reviewed.  Results for orders placed or performed in visit on 01/23/15  HM DIABETES EYE EXAM    Result Value Ref Range   HM Diabetic Eye Exam No Retinopathy No Retinopathy      Assessment & Plan:   Problem List Items Addressed This Visit    Uncontrolled type 2 diabetes with neuropathy ([place]) - Primary    Pt endorses better control with addition of invokana. Coupon provided today. Continue current regimen. Foot exam today. Pt declines pneumovax      Relevant Orders   Hemoglobin A1c   Obesity, Class II, BMI 35-39.9, with comorbidity ([place])    Has not been regularly monitoring weight.      Hypertriglyceridemia    Pt has stopped regular use of fibrate and statin. Recheck FLP today.      Relevant Orders   Lipid panel   Comprehensive metabolic panel   Gout    No gout flare in over a year. Declines gout lowering therapy      Gallbladder polyp    Discussed polyp. rec f/u imaging. Ordered today.      Relevant Orders   US Abdomen Limited RUQ   Fatty liver    Recheck today.          Follow up plan: Return in about 4 months (around 05/23/2015), or as needed, for follow up visit.

## 2015-01-23 NOTE — Assessment & Plan Note (Signed)
Pt has stopped regular use of fibrate and statin. Recheck FLP today.

## 2015-01-23 NOTE — Assessment & Plan Note (Addendum)
No gout flare in over a year. Declines gout lowering therapy

## 2015-01-23 NOTE — Assessment & Plan Note (Signed)
Has not been regularly monitoring weight.

## 2015-01-23 NOTE — Assessment & Plan Note (Signed)
Recheck today. 

## 2015-01-23 NOTE — Addendum Note (Signed)
Addended by: Ria Bush on: 01/23/2015 09:45 AM   Modules accepted: Orders

## 2015-01-23 NOTE — Patient Instructions (Addendum)
Continue invokana 128m daily and amaryl. Coupon provided today.  [facility] We will call you to schedule gallbladder ultrasound to follow polyp.  Return in 4-6 months for follow up visit

## 2015-01-23 NOTE — Assessment & Plan Note (Signed)
Discussed polyp. rec f/u imaging. Ordered today.

## 2015-01-23 NOTE — Assessment & Plan Note (Signed)
Pt endorses better control with addition of invokana. Coupon provided today. Continue current regimen. Foot exam today. Pt declines pneumovax

## 2015-01-28 ENCOUNTER — Other Ambulatory Visit: Payer: Self-pay | Admitting: Family Medicine

## 2015-01-28 MED ORDER — ATORVASTATIN CALCIUM 40 MG PO TABS: 40.0000 mg | ORAL_TABLET | Freq: Every day | ORAL | Status: DC

## 2015-01-29 ENCOUNTER — Encounter: Payer: Self-pay | Admitting: Family Medicine

## 2015-01-30 ENCOUNTER — Other Ambulatory Visit: Payer: Self-pay | Admitting: Family Medicine

## 2015-01-31 ENCOUNTER — Ambulatory Visit
Admission: RE | Admit: 2015-01-31 | Discharge: 2015-01-31 | Disposition: A | Discharge status: Home / Self Care | Payer: BLUE CROSS/BLUE SHIELD | Source: Ambulatory Visit | Attending: Family Medicine | Admitting: Family Medicine

## 2015-01-31 DIAGNOSIS — K760 Fatty (change of) liver, not elsewhere classified: Secondary | ICD-10-CM | POA: Insufficient documentation

## 2015-01-31 DIAGNOSIS — K824 Cholesterolosis of gallbladder: Secondary | ICD-10-CM | POA: Insufficient documentation

## 2015-02-01 ENCOUNTER — Encounter: Payer: Self-pay | Admitting: Family Medicine

## 2015-02-01 IMAGING — US ABDOMEN ULTRASOUND LIMITED
1 series · 14 of 25 positions shown · non-contrast
Comparison: None.

CLINICAL DATA: Elevated liver enzymes

EXAM:
US ABDOMEN LIMITED - RIGHT UPPER QUADRANT

[Series 1: abdomen ultrasound limited · 0.24mm/px · 14 of 53 slices shown]
[im 1/53]
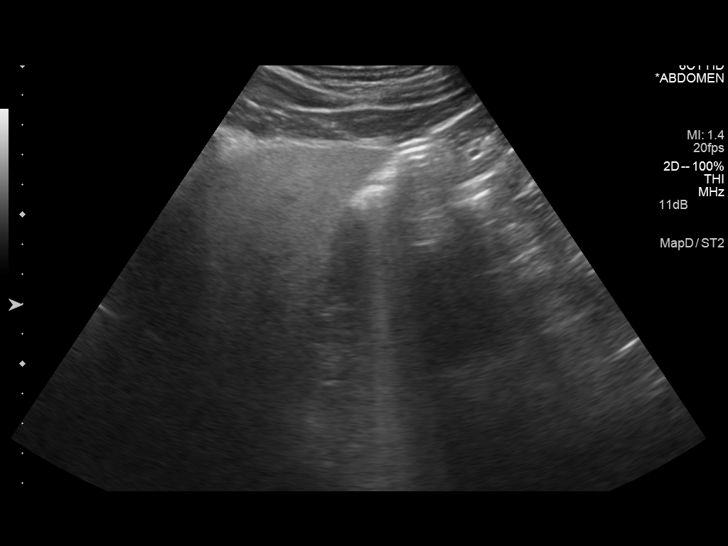
[im 5/53]
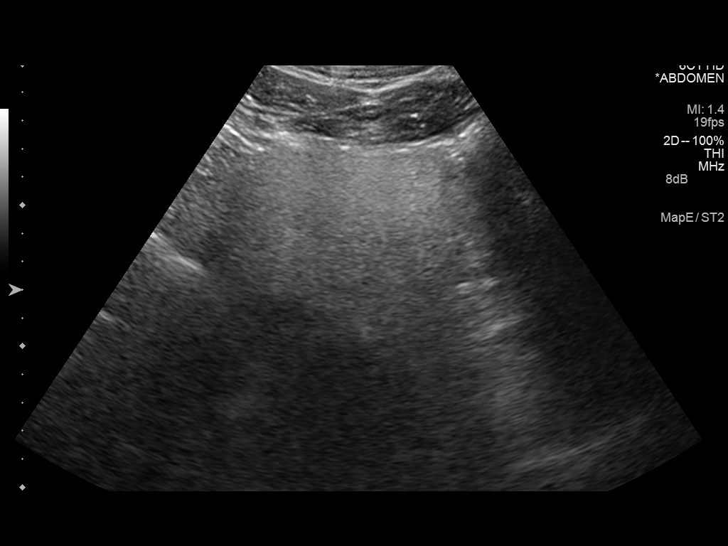
[im 9/53]
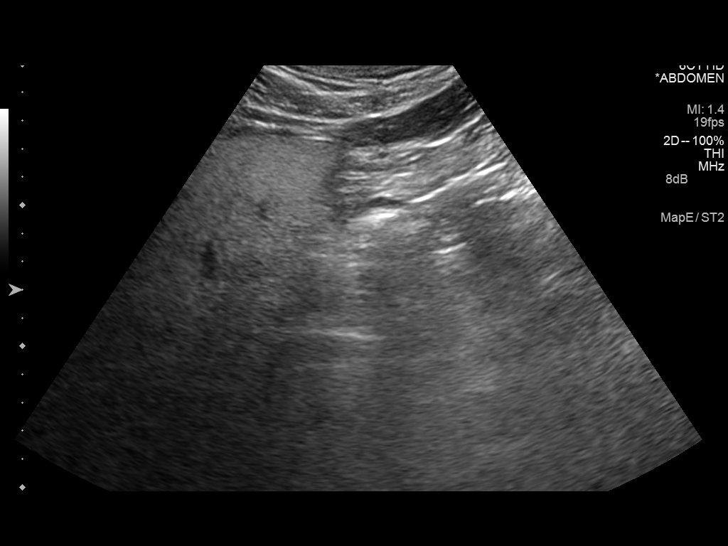
[im 14/53]
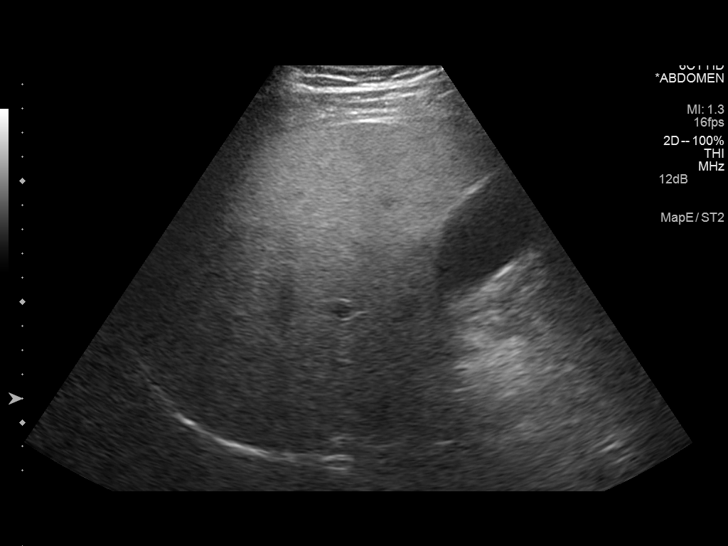
[im 18/53]
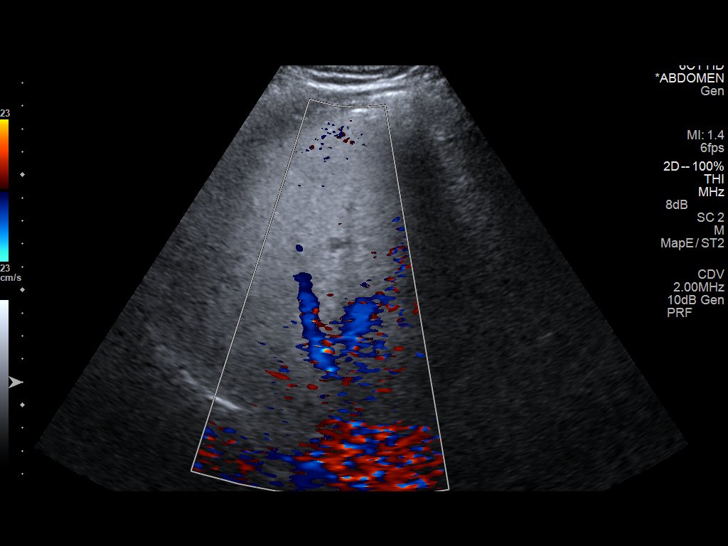
[im 20/53]
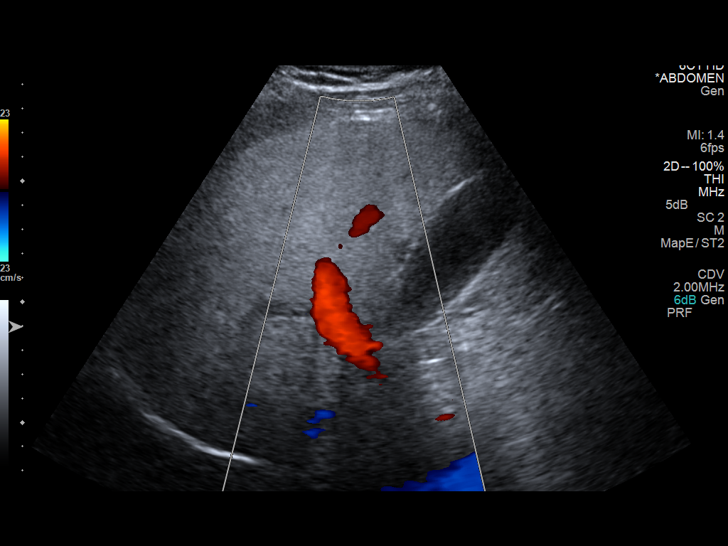
[im 24/53]
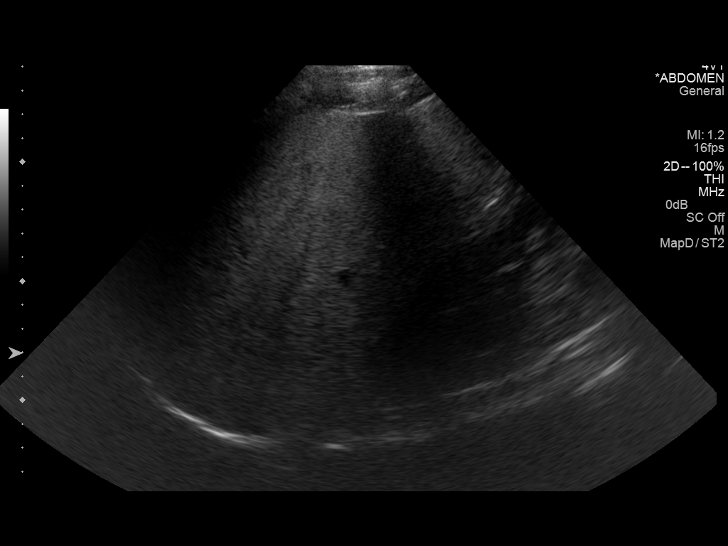
[im 29/53]
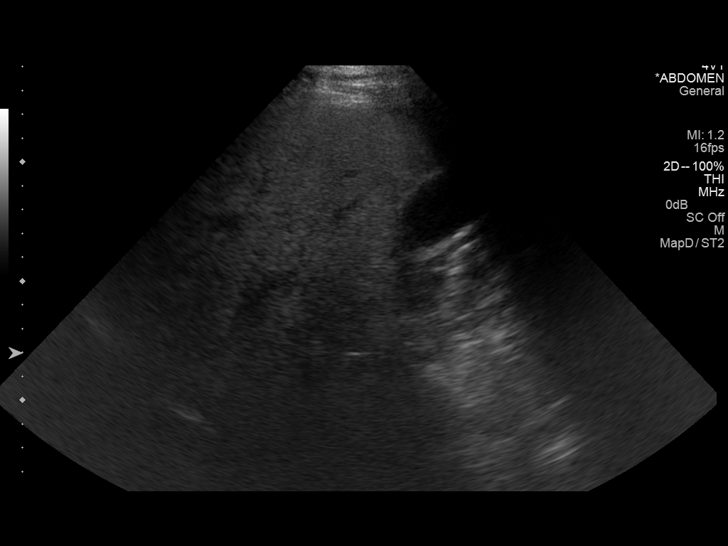
[im 33/53]
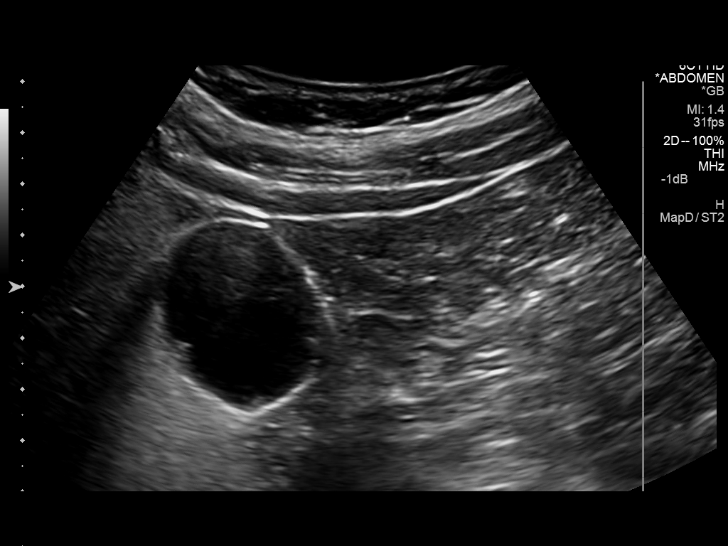
[im 35/53]
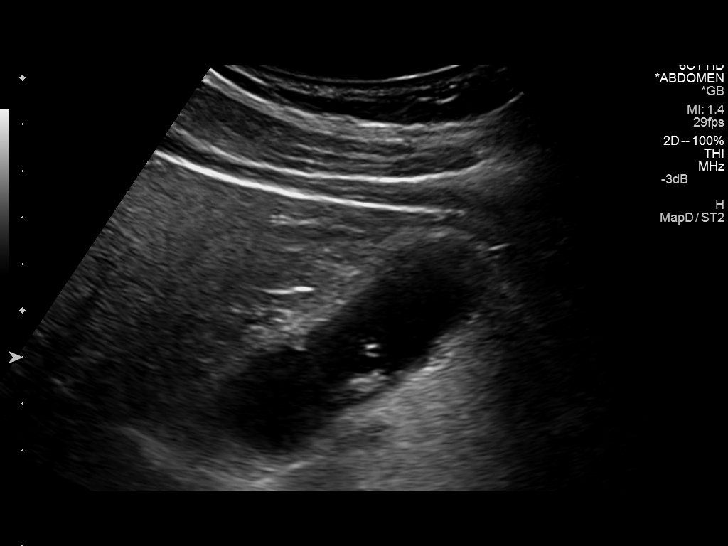
[im 40/53]
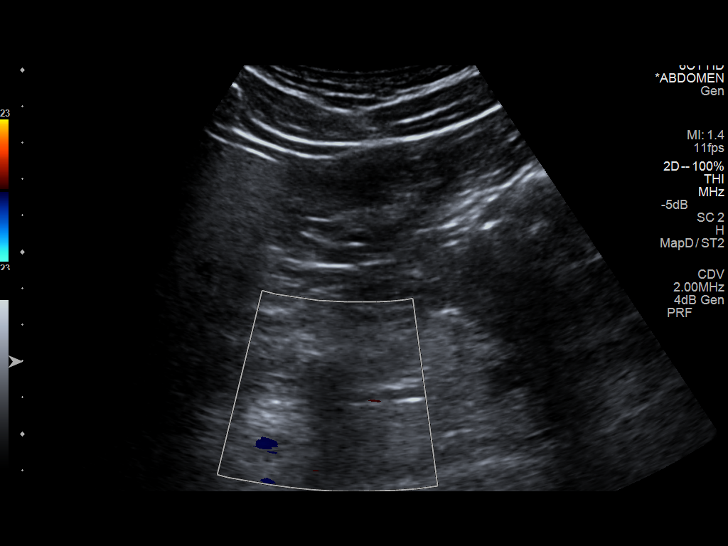
[im 44/53]
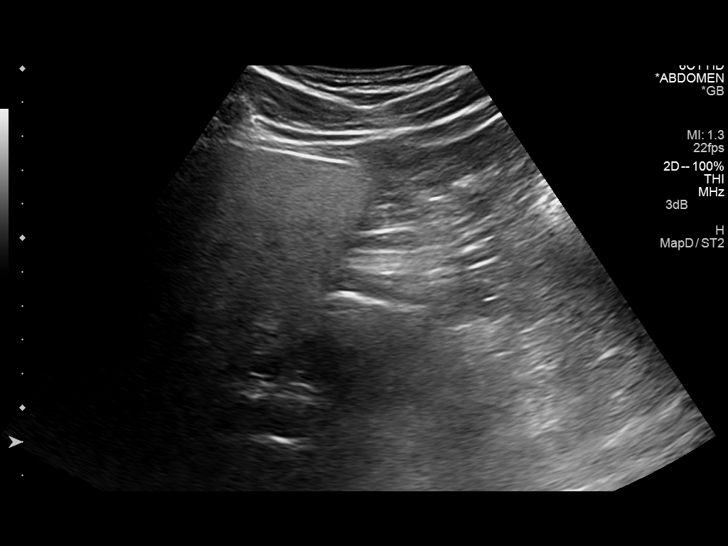
[im 48/53]
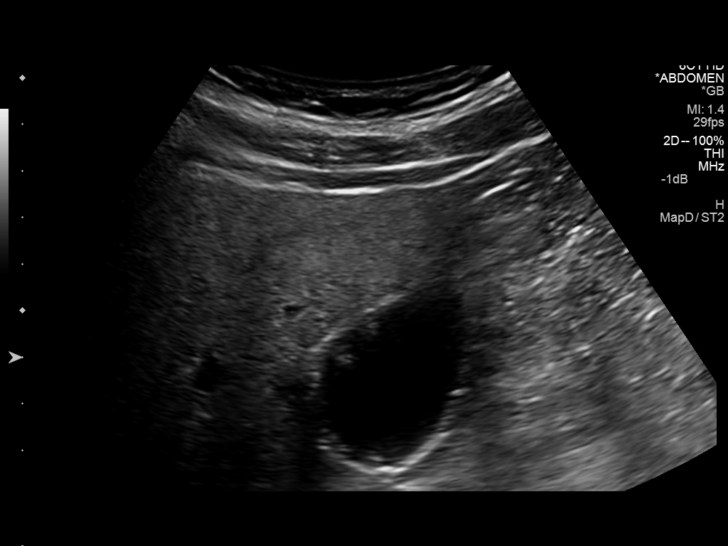
[im 53/53]
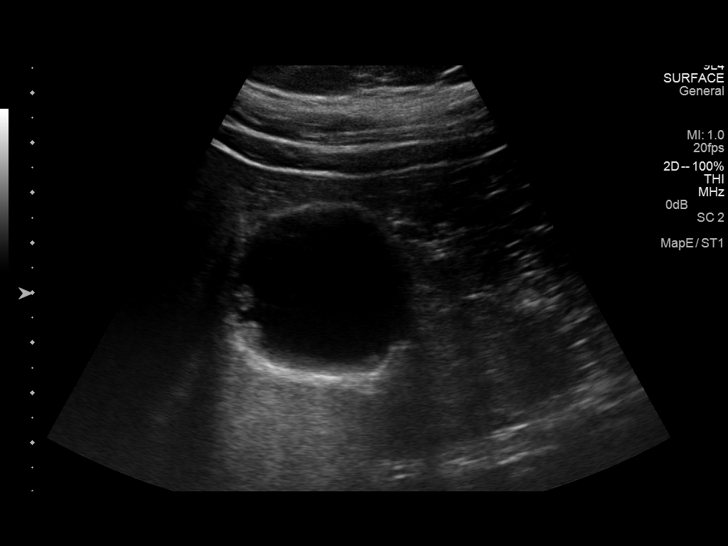

[14 of 25 positions shown; findings below may reference images not displayed]

FINDINGS: Gallbladder:

Within the gallbladder, there are multiple polypoid lesions which
neither moves nor shadow, largest measuring 7 x 7 mm. Most of these
lesions measure between 2 and 4 mm. There are no echogenic foci
which move and shadow as would be expected with gallstones. No
gallbladder wall thickening or pericholecystic fluid. No sonographic
Murphy sign noted.

Common bile duct:

Diameter: 3 mm. There is no intrahepatic or extrahepatic biliary
duct dilatation.

Liver:

No focal lesion identified. Liver shows diffuse increased
echogenicity.
IMPRESSION: Multiple polypoid lesions in the gallbladder as described above.
This finding warrants close clinical and imaging surveillance. A
followup ultrasound in approximately 3 months to assess for
stability would be warranted.

Liver shows diffuse increased echogenicity, probably representing
fatty change. While no focal liver lesions are identified, it must
be cautioned that the sensitivity of ultrasound for focal liver
lesions is diminished given this circumstance.

## 2015-05-23 ENCOUNTER — Encounter: Payer: Self-pay | Admitting: Family Medicine

## 2015-05-23 ENCOUNTER — Ambulatory Visit: Payer: Self-pay | Admitting: Family Medicine

## 2015-05-23 ENCOUNTER — Telehealth: Payer: Self-pay | Admitting: Family Medicine

## 2015-05-23 NOTE — Telephone Encounter (Signed)
Patient did not come for their scheduled appointment today 4 month follow up.  Please let me know if the patient needs to be contacted immediately for follow up or if no follow up is necessary.

## 2015-05-25 ENCOUNTER — Encounter: Payer: Self-pay | Admitting: Family Medicine

## 2015-05-25 ENCOUNTER — Ambulatory Visit (INDEPENDENT_AMBULATORY_CARE_PROVIDER_SITE_OTHER): Payer: BLUE CROSS/BLUE SHIELD | Admitting: Family Medicine

## 2015-05-25 VITALS — BP 124/60 | HR 84 | Temp 98.0°F | Wt 215.8 lb

## 2015-05-25 DIAGNOSIS — IMO0001 Reserved for inherently not codable concepts without codable children: Secondary | ICD-10-CM

## 2015-05-25 DIAGNOSIS — K824 Cholesterolosis of gallbladder: Secondary | ICD-10-CM | POA: Diagnosis not present

## 2015-05-25 DIAGNOSIS — K7581 Nonalcoholic steatohepatitis (NASH): Secondary | ICD-10-CM | POA: Diagnosis not present

## 2015-05-25 DIAGNOSIS — E1165 Type 2 diabetes mellitus with hyperglycemia: Secondary | ICD-10-CM | POA: Diagnosis not present

## 2015-05-25 DIAGNOSIS — E1140 Type 2 diabetes mellitus with diabetic neuropathy, unspecified: Secondary | ICD-10-CM

## 2015-05-25 DIAGNOSIS — E781 Pure hyperglyceridemia: Secondary | ICD-10-CM | POA: Diagnosis not present

## 2015-05-25 DIAGNOSIS — IMO0002 Reserved for concepts with insufficient information to code with codable children: Secondary | ICD-10-CM

## 2015-05-25 LAB — BASIC METABOLIC PANEL
BUN: 19 mg/dL (ref 6–23)
CO2: 30 mEq/L (ref 19–32)
Calcium: 9.9 mg/dL (ref 8.4–10.5)
Chloride: 101 mEq/L (ref 96–112)
Creatinine, Ser: 1.14 mg/dL (ref 0.40–1.50)
GFR: 76.2 mL/min (ref >60.00)
Glucose, Bld: 166 mg/dL — ABNORMAL HIGH (ref 70–99)
Potassium: 4.1 mEq/L (ref 3.5–5.1)
Sodium: 139 mEq/L (ref 135–145)

## 2015-05-25 LAB — MICROALBUMIN / CREATININE URINE RATIO
Creatinine,U: 116.6 mg/dL
Microalb Creat Ratio: 0.6 mg/g (ref 0.0–30.0)
Microalb, Ur: <0.7 mg/dL (ref 0.0–1.9)

## 2015-05-25 LAB — LIPID PANEL
Cholesterol: 144 mg/dL (ref 0–200)
HDL: 27.1 mg/dL — ABNORMAL LOW (ref >39.00)
NonHDL: 117.15
Total CHOL/HDL Ratio: 5
Triglycerides: 228 mg/dL — ABNORMAL HIGH (ref 0.0–149.0)
VLDL: 45.6 mg/dL — ABNORMAL HIGH (ref 0.0–40.0)

## 2015-05-25 LAB — HEMOGLOBIN A1C: Hgb A1c MFr Bld: 8.9 % — ABNORMAL HIGH (ref 4.6–6.5)

## 2015-05-25 LAB — LDL CHOLESTEROL, DIRECT: Direct LDL: 88 mg/dL

## 2015-05-25 NOTE — Assessment & Plan Note (Signed)
Pt has actually been taking both statin and fibrate. Check FLP today. I think he could manage lipids only on statin - will try this when fibrate runs out.

## 2015-05-25 NOTE — Assessment & Plan Note (Signed)
Reviewed recent US results - rec yearly imaging.

## 2015-05-25 NOTE — Assessment & Plan Note (Addendum)
Check A1c today. Continue invokana and amaryl. Discussed amaryl use with meal/PO intake. Declines DSME referral due to financial concerns.

## 2015-05-25 NOTE — Patient Instructions (Addendum)
labwork today. We will see if we can just take atorvastatin to control cholesterol (ok off fenofibrate when you run out).  Weather permitting, restart walking regularly. Return in 4 months for physical.

## 2015-05-25 NOTE — Assessment & Plan Note (Signed)
Body mass index is 38.52 kg/(m^2). maintaining weight. Encouraged restarting walking regimen.

## 2015-05-25 NOTE — Progress Notes (Signed)
Pre visit review using our clinic review tool, if applicable. No additional management support is needed unless otherwise documented below in the visit note. 

## 2015-05-25 NOTE — Progress Notes (Signed)
BP 124/60 mmHg  Pulse 84  Temp(Src) 98 F (36.7 C) (Oral)  Wt 215 lb 12 oz (97.864 kg)   CC: 4 mo f/u visit  Subjective:    Patient ID: Todd Braun, male    DOB: 1975-09-18, 40 y.o.   MRN: [id number]  HPI: Todd Braun is a 40 y.o. male presenting on 05/25/2015 for Follow-up   DM - regularly does not check sugars (last a few months ago). Compliant with antihyperglycemic regimen which includes: amaryl 85m daily, invokana 1023mdaily. Denies UTI/yeast infection sxs. Denies low sugars or hypoglycemic symptoms. Did not tolerate extended release metformin. Denies paresthesias. Last diabetic eye exam 9 15 16  at [facility].Pneumovax: Declines.Prevnar: not due. Has not completed diabetes education - declines for now. Lab Results  Component Value Date   HGBA1C 8.1* 01/23/2015    Diabetic Foot Exam - Simple   No data filed      HLD - taking lipitor and tricor daily. No myalgias.  Gallbladder polyps - monitoring yearly [place] pain.   Relevant past medical, surgical, family and social history reviewed and updated as indicated. Interim medical history since our last visit reviewed. Allergies and medications reviewed and updated. Current Outpatient Prescriptions on File Prior to Visit  Medication Sig  . atorvastatin (LIPITOR) 40 MG tablet Take 1 tablet (40 mg total) by mouth daily.  . Blood Glucose Monitoring Suppl (BLOOD GLUCOSE METER KIT AND SUPPLIES) Use as instructed  . canagliflozin (INVOKANA) 100 MG TABS tablet Take 1 tablet (100 mg total) by mouth daily.  . Marland Kitchentodolac (LODINE) 500 MG tablet Take 500 mg by mouth 2 (two) times daily as needed.  . Marland Kitchenlimepiride (AMARYL) 4 MG tablet Take 1 tablet (4 mg total) by mouth daily before breakfast.  . glucose blood test strip Check 1-2 times daily and PRN E11.40. onetouch ultra meter.  . Lancets 30G MISC 1 each by Does not apply route daily.  . traMADol (ULTRAM) 50 MG tablet Take 1 tablet (50 mg total) by mouth 2 (two) times daily as needed.    No current facility-administered medications on file prior to visit.    Review of Systems Per HPI unless specifically indicated in ROS section     Objective:    BP 124/60 mmHg  Pulse 84  Temp(Src) 98 F (36.7 C) (Oral)  Wt 215 lb 12 oz (97.864 kg)  Wt Readings from Last 3 Encounters:  05/25/15 215 lb 12 oz (97.864 kg)  01/23/15 214 lb 8 oz (97.297 kg)  07/19/14 213 lb (96.616 kg)    Physical Exam  Constitutional: He appears well-developed and well-nourished. No distress.  HENT:  Head: Normocephalic and atraumatic.  Right Ear: External ear normal.  Left Ear: External ear normal.  Nose: Nose normal.  Mouth/Throat: Oropharynx is clear and moist. No oropharyngeal exudate.  Eyes: Conjunctivae and EOM are normal. Pupils are equal, round, and reactive to light. No scleral icterus.  Neck: Normal range of motion. Neck supple.  Cardiovascular: Normal rate, regular rhythm, normal heart sounds and intact distal pulses.   No murmur heard. Pulmonary/Chest: Effort normal and breath sounds normal. No respiratory distress. He has no wheezes. He has no rales.  Musculoskeletal: He exhibits no edema.  See HPI for foot exam if done  Lymphadenopathy:    He has no cervical adenopathy.  Skin: Skin is warm and dry. No rash noted.  Psychiatric: He has a normal mood and affect.  Nursing note and vitals reviewed.  Results for orders placed or  performed in visit on 01/23/15  Lipid panel  Result Value Ref Range   Cholesterol 137 0 - 200 mg/dL   Triglycerides 232.0 (H) 0.0 - 149.0 mg/dL   HDL 24.80 (L) >39.00 mg/dL   VLDL 46.4 (H) 0.0 - 40.0 mg/dL   Total CHOL/HDL Ratio 6    NonHDL 112.65   Hemoglobin A1c  Result Value Ref Range   Hgb A1c MFr Bld 8.1 (H) 4.6 - 6.5 %  Comprehensive metabolic panel  Result Value Ref Range   Sodium 141 135 - 145 mEq/L   Potassium 4.3 3.5 - 5.1 mEq/L   Chloride 103 96 - 112 mEq/L   CO2 27 19 - 32 mEq/L   Glucose, Bld 149 (H) 70 - 99 mg/dL   BUN 17 6 - 23  mg/dL   Creatinine, Ser 1.12 0.40 - 1.50 mg/dL   Total Bilirubin 0.4 0.2 - 1.2 mg/dL   Alkaline Phosphatase 62 39 - 117 U/L   AST 37 0 - 37 U/L   ALT 75 (H) 0 - 53 U/L   Total Protein 8.1 6.0 - 8.3 g/dL   Albumin 4.5 3.5 - 5.2 g/dL   Calcium 10.1 8.4 - 10.5 mg/dL   GFR 77.91 >60.00 mL/min  LDL cholesterol, direct  Result Value Ref Range   Direct LDL 76.0 mg/dL  HM DIABETES EYE EXAM  Result Value Ref Range   HM Diabetic Eye Exam No Retinopathy No Retinopathy      Assessment & Plan:   Problem List Items Addressed This Visit    Uncontrolled type 2 diabetes with neuropathy ([place]) - Primary    Check A1c today. Continue invokana and amaryl. Discussed amaryl use with meal/PO intake. Declines DSME referral due to financial concerns.      Relevant Orders   Hemoglobin V7M   Basic metabolic panel   Microalbumin / creatinine urine ratio   Obesity, Class II, BMI 35-39.9, with comorbidity ([facility])    Body mass index is 38.52 kg/(m^2). maintaining weight. Encouraged restarting walking regimen.      NASH (nonalcoholic steatohepatitis)   Hypertriglyceridemia    Pt has actually been taking both statin and fibrate. Check FLP today. I think he could manage lipids only on statin - will try this when fibrate runs out.        Relevant Orders   Lipid panel   Gallbladder polyp    Reviewed recent US results - rec yearly imaging.           Follow up plan: Return in about 4 months (around 09/24/2015), or as needed, for annual exam, prior fasting for blood work.

## 2015-05-26 ENCOUNTER — Other Ambulatory Visit: Payer: Self-pay | Admitting: Family Medicine

## 2015-05-26 MED ORDER — CANAGLIFLOZIN 300 MG PO TABS: 300.0000 mg | ORAL_TABLET | Freq: Every day | ORAL | Status: DC

## 2015-08-09 ENCOUNTER — Other Ambulatory Visit: Payer: Self-pay | Admitting: Family Medicine

## 2015-09-24 ENCOUNTER — Ambulatory Visit (INDEPENDENT_AMBULATORY_CARE_PROVIDER_SITE_OTHER): Payer: BLUE CROSS/BLUE SHIELD | Admitting: Family Medicine

## 2015-09-24 ENCOUNTER — Encounter: Payer: Self-pay | Admitting: Family Medicine

## 2015-09-24 VITALS — BP 118/84 | HR 68 | Temp 97.4°F | Ht 63.5 in | Wt 216.5 lb

## 2015-09-24 DIAGNOSIS — Z0000 Encounter for general adult medical examination without abnormal findings: Secondary | ICD-10-CM | POA: Diagnosis not present

## 2015-09-24 DIAGNOSIS — E781 Pure hyperglyceridemia: Secondary | ICD-10-CM | POA: Diagnosis not present

## 2015-09-24 DIAGNOSIS — M109 Gout, unspecified: Secondary | ICD-10-CM

## 2015-09-24 DIAGNOSIS — E1165 Type 2 diabetes mellitus with hyperglycemia: Secondary | ICD-10-CM | POA: Diagnosis not present

## 2015-09-24 DIAGNOSIS — E1140 Type 2 diabetes mellitus with diabetic neuropathy, unspecified: Secondary | ICD-10-CM | POA: Diagnosis not present

## 2015-09-24 DIAGNOSIS — IMO0001 Reserved for inherently not codable concepts without codable children: Secondary | ICD-10-CM

## 2015-09-24 DIAGNOSIS — K824 Cholesterolosis of gallbladder: Secondary | ICD-10-CM

## 2015-09-24 DIAGNOSIS — IMO0002 Reserved for concepts with insufficient information to code with codable children: Secondary | ICD-10-CM

## 2015-09-24 DIAGNOSIS — K7581 Nonalcoholic steatohepatitis (NASH): Secondary | ICD-10-CM | POA: Diagnosis not present

## 2015-09-24 LAB — LIPID PANEL
Cholesterol: 174 mg/dL (ref 0–200)
HDL: 26.9 mg/dL — ABNORMAL LOW (ref >39.00)
Total CHOL/HDL Ratio: 6
Triglycerides: 424 mg/dL — ABNORMAL HIGH (ref 0.0–149.0)

## 2015-09-24 LAB — COMPREHENSIVE METABOLIC PANEL
ALT: 75 U/L — ABNORMAL HIGH (ref 0–53)
AST: 40 U/L — ABNORMAL HIGH (ref 0–37)
Albumin: 4.2 g/dL (ref 3.5–5.2)
Alkaline Phosphatase: 57 U/L (ref 39–117)
BUN: 16 mg/dL (ref 6–23)
CO2: 27 mEq/L (ref 19–32)
Calcium: 9.8 mg/dL (ref 8.4–10.5)
Chloride: 102 mEq/L (ref 96–112)
Creatinine, Ser: 1.04 mg/dL (ref 0.40–1.50)
GFR: 84.57 mL/min (ref >60.00)
Glucose, Bld: 142 mg/dL — ABNORMAL HIGH (ref 70–99)
Potassium: 4.2 mEq/L (ref 3.5–5.1)
Sodium: 138 mEq/L (ref 135–145)
Total Bilirubin: 0.5 mg/dL (ref 0.2–1.2)
Total Protein: 7.6 g/dL (ref 6.0–8.3)

## 2015-09-24 LAB — HEMOGLOBIN A1C: Hgb A1c MFr Bld: 8.1 % — ABNORMAL HIGH (ref 4.6–6.5)

## 2015-09-24 LAB — URIC ACID: Uric Acid, Serum: 9.5 mg/dL — ABNORMAL HIGH (ref 4.0–7.8)

## 2015-09-24 LAB — LDL CHOLESTEROL, DIRECT: Direct LDL: 92 mg/dL

## 2015-09-24 NOTE — Addendum Note (Signed)
Addended by: Ellamae Sia on: 09/24/2015 08:45 AM   Modules accepted: Miquel Dunn

## 2015-09-24 NOTE — Assessment & Plan Note (Signed)
Check LFTs today.

## 2015-09-24 NOTE — Assessment & Plan Note (Signed)
Discussed healthy diet and lifestyle changes to affect sustainable weight loss  

## 2015-09-24 NOTE — Progress Notes (Signed)
BP 118/84 mmHg  Pulse 68  Temp(Src) 97.4 F (36.3 C) (Oral)  Ht 5' 3.5" (1.613 m)  Wt 216 lb 8 oz (98.204 kg)  BMI 37.75 kg/m2   CC: CPE  Subjective:    Patient ID: Todd Braun, male    DOB: 10-May-1975, 40 y.o.   MRN: [id number]  HPI: Todd Braun is a 40 y.o. male presenting on 09/24/2015 for Annual Exam   DM - taking amaryl and invokana daily. Declines DSME.  HLD - taking lipitor daily. No myalgias. No longer on tricor.  Gallbladder polyps - monitoring yearly [place]. No RUQ pain. Latest 01/2015. NASH.  Preventative:  Flu shot did not receive last year Tdap 2013 Pneumonia shot - declines Seat belt use discussed Sunscreen use discussed. No changing moles on skin.  Caffeine: 3-4 cups/night coffee  Lives with wife, son (2005) and twins (2012), 1 outside cat  Occupation: toolmaker  Edu: HS  Activity: nothing currently  Diet: good water, fruits/vegetables occasionally   Relevant past medical, surgical, family and social history reviewed and updated as indicated. Interim medical history since our last visit reviewed. Allergies and medications reviewed and updated. Current Outpatient Prescriptions on File Prior to Visit  Medication Sig  . atorvastatin (LIPITOR) 40 MG tablet Take 1 tablet (40 mg total) by mouth daily.  . Blood Glucose Monitoring Suppl (BLOOD GLUCOSE METER KIT AND SUPPLIES) Use as instructed  . canagliflozin (INVOKANA) 300 MG TABS tablet Take 1 tablet (300 mg total) by mouth daily.  Marland Kitchen etodolac (LODINE) 500 MG tablet Take 500 mg by mouth 2 (two) times daily as needed.  Marland Kitchen glimepiride (AMARYL) 4 MG tablet TAKE 1 TABLET DAILY BEFORE BREAKFAST  . glucose blood test strip Check 1-2 times daily and PRN E11.40. onetouch ultra meter.  . Lancets 30G MISC 1 each by Does not apply route daily.  . traMADol (ULTRAM) 50 MG tablet Take 1 tablet (50 mg total) by mouth 2 (two) times daily as needed.   No current facility-administered medications on file prior to visit.    Review of  Systems  Constitutional: Negative for fever, chills, activity change, appetite change, fatigue and unexpected weight change.  HENT: Negative for hearing loss.   Eyes: Negative for visual disturbance.  Respiratory: Negative for cough, chest tightness, shortness of breath and wheezing.   Cardiovascular: Negative for chest pain, palpitations and leg swelling.  Gastrointestinal: Negative for nausea, vomiting, abdominal pain, diarrhea, constipation, blood in stool and abdominal distention.  Genitourinary: Negative for hematuria and difficulty urinating.  Musculoskeletal: Negative for myalgias, arthralgias and neck pain.  Skin: Negative for rash.  Neurological: Negative for dizziness, seizures, syncope and headaches.  Hematological: Negative for adenopathy. Does not bruise/bleed easily.  Psychiatric/Behavioral: Negative for dysphoric mood. The patient is not nervous/anxious.    Per HPI unless specifically indicated in ROS section     Objective:    BP 118/84 mmHg  Pulse 68  Temp(Src) 97.4 F (36.3 C) (Oral)  Ht 5' 3.5" (1.613 m)  Wt 216 lb 8 oz (98.204 kg)  BMI 37.75 kg/m2  Wt Readings from Last 3 Encounters:  09/24/15 216 lb 8 oz (98.204 kg)  05/25/15 215 lb 12 oz (97.864 kg)  01/23/15 214 lb 8 oz (97.297 kg)    Physical Exam  Constitutional: He is oriented to person, place, and time. He appears well-developed and well-nourished. No distress.  HENT:  Head: Normocephalic and atraumatic.  Right Ear: Hearing, tympanic membrane, external ear and ear canal normal.  Left Ear: Hearing,  tympanic membrane, external ear and ear canal normal.  Nose: Nose normal.  Mouth/Throat: Uvula is midline, oropharynx is clear and moist and mucous membranes are normal. No oropharyngeal exudate, posterior oropharyngeal edema or posterior oropharyngeal erythema.  Eyes: Conjunctivae and EOM are normal. Pupils are equal, round, and reactive to light. No scleral icterus.  Neck: Normal range of motion. Neck  supple. No thyromegaly present.  Cardiovascular: Normal rate, regular rhythm, normal heart sounds and intact distal pulses.   No murmur heard. Pulses:      Radial pulses are 2+ on the right side, and 2+ on the left side.  Pulmonary/Chest: Effort normal and breath sounds normal. No respiratory distress. He has no wheezes. He has no rales.  Abdominal: Soft. Bowel sounds are normal. He exhibits no distension and no mass. There is no tenderness. There is no rebound and no guarding.  Musculoskeletal: Normal range of motion. He exhibits no edema.  Lymphadenopathy:    He has no cervical adenopathy.  Neurological: He is alert and oriented to person, place, and time.  CN grossly intact, station and gait intact  Skin: Skin is warm and dry. No rash noted.  Psychiatric: He has a normal mood and affect. His behavior is normal. Judgment and thought content normal.  Nursing note and vitals reviewed.  Results for orders placed or performed in visit on 05/25/15  Lipid panel  Result Value Ref Range   Cholesterol 144 0 - 200 mg/dL   Triglycerides 228.0 (H) 0.0 - 149.0 mg/dL   HDL 27.10 (L) >39.00 mg/dL   VLDL 45.6 (H) 0.0 - 40.0 mg/dL   Total CHOL/HDL Ratio 5    NonHDL 117.15   Hemoglobin A1c  Result Value Ref Range   Hgb A1c MFr Bld 8.9 (H) 4.6 - 6.5 %  Basic metabolic panel  Result Value Ref Range   Sodium 139 135 - 145 mEq/L   Potassium 4.1 3.5 - 5.1 mEq/L   Chloride 101 96 - 112 mEq/L   CO2 30 19 - 32 mEq/L   Glucose, Bld 166 (H) 70 - 99 mg/dL   BUN 19 6 - 23 mg/dL   Creatinine, Ser 1.14 0.40 - 1.50 mg/dL   Calcium 9.9 8.4 - 10.5 mg/dL   GFR 76.20 >60.00 mL/min  Microalbumin / creatinine urine ratio  Result Value Ref Range   Microalb, Ur <0.7 0.0 - 1.9 mg/dL   Creatinine,U 116.6 mg/dL   Microalb Creat Ratio 0.6 0.0 - 30.0 mg/g  LDL cholesterol, direct  Result Value Ref Range   Direct LDL 88.0 mg/dL   Lab Results  Component Value Date   ALT 75* 01/23/2015   AST 37 01/23/2015    ALKPHOS 62 01/23/2015   BILITOT 0.4 01/23/2015       Assessment & Plan:   Problem List Items Addressed This Visit    Obesity, Class II, BMI 35-39.9, with comorbidity ([place])    Discussed healthy diet and lifestyle changes to affect sustainable weight loss.       Gout    Stable period. Check urate. No gout flare in >1.5 yrs      Relevant Orders   Uric acid   Hypertriglyceridemia    Last visit we stopped tricor, continued lipitor 31m daily. Check FLP today.       Relevant Orders   Lipid panel   [place]controlled [place] diabetes [place]    Continues to decline DSME.  Reports compliance with glimepiride and invokana (not taking glimepiride with meal).  Discussed glycemic index in different fruits.       Relevant Orders   Hemoglobin A1c   NASH (nonalcoholic steatohepatitis)    Check LFTs today.      Relevant Orders   Comprehensive metabolic panel   Health maintenance examination - Primary    Preventative protocols reviewed and updated unless pt declined. Discussed healthy diet and lifestyle.       Gallbladder polyp    Rpt [place] at end of year.           Follow up plan: Return in about 4 months (around 01/25/2016), or as needed, for follow up visit.  Ria Bush, MD

## 2015-09-24 NOTE — Assessment & Plan Note (Signed)
Rpt [place] at end of year.

## 2015-09-24 NOTE — Progress Notes (Signed)
Pre visit review using our clinic review tool, if applicable. No additional management support is needed unless otherwise documented below in the visit note. 

## 2015-09-24 NOTE — Patient Instructions (Addendum)
Take glimepiride with meals.  Blood work today. Work on incorporating some activity into routine.  Good to see you today, call us with quesitons. Return in 4 months for follow up visit.  Health Maintenance, Male A healthy lifestyle and preventative care can promote health and wellness.  Maintain regular health, dental, and eye exams.  Eat a healthy diet. Foods like vegetables, fruits, whole grains, low-fat dairy products, and lean protein foods contain the nutrients you need and are low in calories. Decrease your intake of foods high in solid fats, added sugars, and salt. Get information about a proper diet from your health care provider, if necessary.  Regular physical exercise is one of the most important things you can do for your health. Most adults should get at least 150 minutes of moderate-intensity exercise (any activity that increases your heart rate and causes you to sweat) each week. In addition, most adults need muscle-strengthening exercises on 2 or more days a week.   Maintain a healthy weight. The body mass index (BMI) is a screening tool to identify possible weight problems. It provides an estimate of body fat based on height and weight. Your health care provider can find your BMI and can help you achieve or maintain a healthy weight. For males 20 years and older:  A BMI below 18.5 is considered underweight.  A BMI of 18.5 to 24.9 is normal.  A BMI of 25 to 29.9 is considered overweight.  A BMI of 30 and above is considered obese.  Maintain normal blood lipids and cholesterol by exercising and minimizing your intake of saturated fat. Eat a balanced diet with plenty of fruits and vegetables. Blood tests for lipids and cholesterol should begin at age 13 and be repeated every 5 years. If your lipid or cholesterol levels are high, you are over age 60, or you are at high risk for heart disease, you may need your cholesterol levels checked more frequently.Ongoing high lipid and  cholesterol levels should be treated with medicines if diet and exercise are not working.  If you smoke, find out from your health care provider how to quit. If you do not use tobacco, do not start.  Lung cancer screening is recommended for adults aged 62-80 years who are at high risk for developing lung cancer because of a history of smoking. A yearly low-dose CT scan of the lungs is recommended for people who have at least a 30-pack-year history of smoking and are current smokers or have quit within the past 15 years. A pack year of smoking is smoking an average of 1 pack of cigarettes a day for 1 year (for example, a 30-pack-year history of smoking could mean smoking 1 pack a day for 30 years or 2 packs a day for 15 years). Yearly screening should continue until the smoker has stopped smoking for at least 15 years. Yearly screening should be stopped for people who develop a health problem that would prevent them from having lung cancer treatment.  If you choose to drink alcohol, do not have more than 2 drinks per day. One drink is considered to be 12 oz (360 mL) of beer, 5 oz (150 mL) of wine, or 1.5 oz (45 mL) of liquor.  Avoid the use of street drugs. Do not share needles with anyone. Ask for help if you need support or instructions about stopping the use of drugs.  High blood pressure causes heart disease and increases the risk of stroke. High blood pressure is more  likely to develop in:  People who have blood pressure in the end of the normal range (100-139/85-89 mm Hg).  People who are overweight or obese.  People who are African American.  If you are 58-30 years of age, have your blood pressure checked every 3-5 years. If you are 50 years of age or older, have your blood pressure checked every year. You should have your blood pressure measured twice--once when you are at a hospital or clinic, and once when you are not at a hospital or clinic. Record the average of the two measurements. To  check your blood pressure when you are not at a hospital or clinic, you can use:  An automated blood pressure machine at a pharmacy.  A home blood pressure monitor.  If you are 23-18 years old, ask your health care provider if you should take aspirin to prevent heart disease.  Diabetes screening involves taking a blood sample to check your fasting blood sugar level. This should be done once every 3 years after age 11 if you are at a normal weight and without risk factors for diabetes. Testing should be considered at a younger age or be carried out more frequently if you are overweight and have at least 1 risk factor for diabetes.  Colorectal cancer can be detected and often prevented. Most routine colorectal cancer screening begins at the age of 100 and continues through age 95. However, your health care provider may recommend screening at an earlier age if you have risk factors for colon cancer. On a yearly basis, your health care provider may provide home test kits to check for hidden blood in the stool. A small camera at the end of a tube may be used to directly examine the colon (sigmoidoscopy or colonoscopy) to detect the earliest forms of colorectal cancer. Talk to your health care provider about this at age 81 when routine screening begins. A direct exam of the colon should be repeated every 5-10 years through age 39, unless early forms of precancerous polyps or small growths are found.  People who are at an increased risk for hepatitis B should be screened for this virus. You are considered at high risk for hepatitis B if:  You were born in a country where hepatitis B occurs often. Talk with your health care provider about which countries are considered high risk.  Your parents were born in a high-risk country and you have not received a shot to protect against hepatitis B (hepatitis B vaccine).  You have HIV or AIDS.  You use needles to inject street drugs.  You live with, or have sex  with, someone who has hepatitis B.  You are a man who has sex with other men (MSM).  You get hemodialysis treatment.  You take certain medicines for conditions like cancer, organ transplantation, and autoimmune conditions.  Hepatitis C blood testing is recommended for all people born from [birth year] through 1964 and any individual with known risk factors for hepatitis C.  Healthy men should no longer receive prostate-specific antigen (PSA) blood tests as part of routine cancer screening. Talk to your health care provider about prostate cancer screening.  Testicular cancer screening is not recommended for adolescents or adult males who have no symptoms. Screening includes self-exam, a health care provider exam, and other screening tests. Consult with your health care provider about any symptoms you have or any concerns you have about testicular cancer.  Practice safe sex. Use condoms and avoid high-risk sexual practices  to reduce the spread of sexually transmitted infections (STIs).  You should be screened for STIs, including gonorrhea and chlamydia if:  You are sexually active and are younger than 24 years.  You are older than 24 years, and your health care provider tells you that you are at risk for this type of infection.  Your sexual activity has changed since you were last screened, and you are at an increased risk for chlamydia or gonorrhea. Ask your health care provider if you are at risk.  If you are at risk of being infected with HIV, it is recommended that you take a prescription medicine daily to prevent HIV infection. This is called pre-exposure prophylaxis (PrEP). You are considered at risk if:  You are a man who has sex with other men (MSM).  You are a heterosexual man who is sexually active with multiple partners.  You take drugs by injection.  You are sexually active with a partner who has HIV.  Talk with your health care provider about whether you are at high risk of being  infected with HIV. If you choose to begin PrEP, you should first be tested for HIV. You should then be tested every 3 months for as long as you are taking PrEP.  Use sunscreen. Apply sunscreen liberally and repeatedly throughout the day. You should seek shade when your shadow is shorter than you. Protect yourself by wearing long sleeves, pants, a wide-brimmed hat, and sunglasses year round whenever you are outdoors.  Tell your health care provider of new moles or changes in moles, especially if there is a change in shape or color. Also, tell your health care provider if a mole is larger than the size of a pencil eraser.  A one-time screening for abdominal aortic aneurysm (AAA) and surgical repair of large AAAs by ultrasound is recommended for men aged 39-75 years who are current or former smokers.  Stay current with your vaccines (immunizations).   This information is not intended to replace advice given to you by your health care provider. Make sure you discuss any questions you have with your health care provider.   Document Released: 09/06/2007 Document Revised: 03/31/2014 Document Reviewed: 08/05/2010 Elsevier Interactive Patient Education [employer].

## 2015-09-24 NOTE — Assessment & Plan Note (Signed)
Preventative protocols reviewed and updated unless pt declined. Discussed healthy diet and lifestyle.  

## 2015-09-24 NOTE — Assessment & Plan Note (Signed)
Stable period. Check urate. No gout flare in >1.5 yrs

## 2015-09-24 NOTE — Assessment & Plan Note (Signed)
Last visit we stopped tricor, continued lipitor 29m daily. Check FLP today.

## 2015-09-24 NOTE — Assessment & Plan Note (Signed)
Continues to decline DSME.  Reports compliance with glimepiride and invokana (not taking glimepiride with meal).  Discussed glycemic index in different fruits.

## 2015-09-27 ENCOUNTER — Other Ambulatory Visit: Payer: Self-pay | Admitting: Family Medicine

## 2015-09-27 MED ORDER — ATORVASTATIN CALCIUM 80 MG PO TABS: 80.0000 mg | ORAL_TABLET | Freq: Every day | ORAL | Status: DC

## 2015-09-27 MED ORDER — GLIMEPIRIDE 4 MG PO TABS: 4.0000 mg | ORAL_TABLET | Freq: Two times a day (BID) | ORAL | Status: DC

## 2015-12-26 ENCOUNTER — Encounter: Payer: Self-pay | Admitting: Family Medicine

## 2016-02-04 ENCOUNTER — Ambulatory Visit: Payer: Self-pay | Admitting: Family Medicine

## 2016-02-05 ENCOUNTER — Telehealth: Payer: Self-pay | Admitting: Family Medicine

## 2016-02-05 NOTE — Telephone Encounter (Signed)
Patient did not come in for their appointment on 02/04/16 for 4 month follow up.  Please let me know if patient needs to be contacted immediately for follow up or no follow up needed.

## 2016-02-08 NOTE — Telephone Encounter (Signed)
No f/u at this time.

## 2016-03-18 ENCOUNTER — Encounter: Payer: Self-pay | Admitting: Family Medicine

## 2016-03-18 NOTE — Telephone Encounter (Signed)
Please see Mychart message. He asks for No Show fee to be waived.

## 2016-03-20 ENCOUNTER — Ambulatory Visit (INDEPENDENT_AMBULATORY_CARE_PROVIDER_SITE_OTHER): Payer: BLUE CROSS/BLUE SHIELD | Admitting: Family Medicine

## 2016-03-20 ENCOUNTER — Encounter: Payer: Self-pay | Admitting: Family Medicine

## 2016-03-20 VITALS — BP 134/84 | HR 96 | Temp 97.8°F | Wt 218.5 lb

## 2016-03-20 DIAGNOSIS — E1140 Type 2 diabetes mellitus with diabetic neuropathy, unspecified: Secondary | ICD-10-CM | POA: Diagnosis not present

## 2016-03-20 DIAGNOSIS — E1165 Type 2 diabetes mellitus with hyperglycemia: Secondary | ICD-10-CM

## 2016-03-20 DIAGNOSIS — E781 Pure hyperglyceridemia: Secondary | ICD-10-CM | POA: Diagnosis not present

## 2016-03-20 DIAGNOSIS — K824 Cholesterolosis of gallbladder: Secondary | ICD-10-CM | POA: Diagnosis not present

## 2016-03-20 DIAGNOSIS — K7581 Nonalcoholic steatohepatitis (NASH): Secondary | ICD-10-CM | POA: Diagnosis not present

## 2016-03-20 DIAGNOSIS — IMO0002 Reserved for concepts with insufficient information to code with codable children: Secondary | ICD-10-CM

## 2016-03-20 LAB — COMPREHENSIVE METABOLIC PANEL
ALT: 104 U/L — ABNORMAL HIGH (ref 0–53)
AST: 63 U/L — ABNORMAL HIGH (ref 0–37)
Albumin: 4.5 g/dL (ref 3.5–5.2)
Alkaline Phosphatase: 68 U/L (ref 39–117)
BUN: 13 mg/dL (ref 6–23)
CO2: 32 mEq/L (ref 19–32)
Calcium: 9.7 mg/dL (ref 8.4–10.5)
Chloride: 101 mEq/L (ref 96–112)
Creatinine, Ser: 0.96 mg/dL (ref 0.40–1.50)
GFR: 92.52 mL/min (ref >60.00)
Glucose, Bld: 147 mg/dL — ABNORMAL HIGH (ref 70–99)
Potassium: 3.9 mEq/L (ref 3.5–5.1)
Sodium: 139 mEq/L (ref 135–145)
Total Bilirubin: 0.8 mg/dL (ref 0.2–1.2)
Total Protein: 7.8 g/dL (ref 6.0–8.3)

## 2016-03-20 LAB — CBC WITH DIFFERENTIAL/PLATELET
Basophils Absolute: 0 10*3/uL (ref 0.0–0.1)
Basophils Relative: 0.5 % (ref 0.0–3.0)
Eosinophils Absolute: 0.1 10*3/uL (ref 0.0–0.7)
Eosinophils Relative: 2.1 % (ref 0.0–5.0)
HCT: 46.2 % (ref 39.0–52.0)
Hemoglobin: 16 g/dL (ref 13.0–17.0)
Lymphocytes Relative: 31.2 % (ref 12.0–46.0)
Lymphs Abs: 2.1 10*3/uL (ref 0.7–4.0)
MCHC: 34.7 g/dL (ref 30.0–36.0)
MCV: 83.7 fl (ref 78.0–100.0)
Monocytes Absolute: 0.6 10*3/uL (ref 0.1–1.0)
Monocytes Relative: 8.8 % (ref 3.0–12.0)
Neutro Abs: 3.9 10*3/uL (ref 1.4–7.7)
Neutrophils Relative %: 57.4 % (ref 43.0–77.0)
Platelets: 184 10*3/uL (ref 150.0–400.0)
RBC: 5.51 Mil/uL (ref 4.22–5.81)
RDW: 13.6 % (ref 11.5–15.5)
WBC: 6.8 10*3/uL (ref 4.0–10.5)

## 2016-03-20 LAB — TRIGLYCERIDES: Triglycerides: 543 mg/dL — ABNORMAL HIGH (ref 0.0–149.0)

## 2016-03-20 LAB — HEMOGLOBIN A1C: Hgb A1c MFr Bld: 8.5 % — ABNORMAL HIGH (ref 4.6–6.5)

## 2016-03-20 MED ORDER — CANAGLIFLOZIN 300 MG PO TABS: 300.0000 mg | ORAL_TABLET | Freq: Every day | ORAL | 11 refills | Status: DC

## 2016-03-20 NOTE — Patient Instructions (Addendum)
Schedule eye exam as you're due.  Invokana coupon provided today.  Labs today.  Return in 6 months for physical.

## 2016-03-20 NOTE — Assessment & Plan Note (Addendum)
Chronic, uncontrolled. Not checking sugars.  Has declined DSME and pneumovax.  Check A1c today.  Continue glimepiride (rec take with meal) and invokana. Coupon for invokana provided for the new year - tolerating well.

## 2016-03-20 NOTE — Progress Notes (Signed)
Pre visit review using our clinic review tool, if applicable. No additional management support is needed unless otherwise documented below in the visit note. 

## 2016-03-20 NOTE — Progress Notes (Signed)
BP 134/84   Pulse 96   Temp 97.8 F (36.6 C) (Oral)   Wt 218 lb 8 oz (99.1 kg)   BMI 38.10 kg/m    CC: 6 mo f/u visit Subjective:    Patient ID: Todd Braun, male    DOB: [birth year], 40 y.o.   MRN: [id number]  HPI: Todd Braun is a 40 y.o. male presenting on 03/20/2016 for Follow-up   DM - regularly does not check sugars - latest fasting 130 about 1 month ago. Compliant with antihyperglycemic regimen which includes: amaryl and invokana. Denies low sugars or hypoglycemic symptoms. No UTI, yeast infections. Denies paresthesias. Last diabetic eye exam 11/2014.  Pneumovax: declines.  Prevnar: not due. Declines DSME.  Lab Results  Component Value Date   HGBA1C 8.1 (H) 09/24/2015   Diabetic Foot Exam - Simple   Simple Foot Form Diabetic Foot exam was performed with the following findings:  Yes 03/20/2016 12:36 PM  Visual Inspection No deformities, no ulcerations, no other skin breakdown bilaterally:  Yes Sensation Testing Intact to touch and monofilament testing bilaterally:  Yes Pulse Check Posterior Tibialis and Dorsalis pulse intact bilaterally:  Yes Comments      HLD - on lipitor daily without myalgias. No longer on tricor.  Gallbladder polyps and NASH - monitoring yearly with [place].   Gout - no recent flare in last 2 yrs.   Relevant past medical, surgical, family and social history reviewed and updated as indicated. Interim medical history since our last visit reviewed. Allergies and medications reviewed and updated. Current Outpatient Prescriptions on File Prior to Visit  Medication Sig  . atorvastatin (LIPITOR) 80 MG tablet Take 1 tablet (80 mg total) by mouth daily.  . Blood Glucose Monitoring Suppl (BLOOD GLUCOSE METER KIT AND SUPPLIES) Use as instructed  . etodolac (LODINE) 500 MG tablet Take 500 mg by mouth 2 (two) times daily as needed.  Marland Kitchen glimepiride (AMARYL) 4 MG tablet Take 1 tablet (4 mg total) by mouth 2 (two) times daily. With meals  . glucose blood test strip  Check 1-2 times daily and PRN E11.40. onetouch ultra meter.  . Lancets 30G MISC 1 each by Does not apply route daily.  . traMADol (ULTRAM) 50 MG tablet Take 1 tablet (50 mg total) by mouth 2 (two) times daily as needed.   No current facility-administered medications on file prior to visit.     Review of Systems Per HPI unless specifically indicated in ROS section     Objective:    BP 134/84   Pulse 96   Temp 97.8 F (36.6 C) (Oral)   Wt 218 lb 8 oz (99.1 kg)   BMI 38.10 kg/m   Wt Readings from Last 3 Encounters:  03/20/16 218 lb 8 oz (99.1 kg)  09/24/15 216 lb 8 oz (98.2 kg)  05/25/15 215 lb 12 oz (97.9 kg)    Physical Exam  Constitutional: He appears well-developed and well-nourished. No distress.  HENT:  Head: Normocephalic and atraumatic.  Right Ear: External ear normal.  Left Ear: External ear normal.  Nose: Nose normal.  Mouth/Throat: Oropharynx is clear and moist. No oropharyngeal exudate.  Eyes: Conjunctivae and EOM are normal. Pupils are equal, round, and reactive to light. No scleral icterus.  Neck: Normal range of motion. Neck supple.  Cardiovascular: Normal rate, regular rhythm, normal heart sounds and intact distal pulses.   No murmur heard. Pulmonary/Chest: Effort normal and breath sounds normal. No respiratory distress. He has no wheezes. He has no rales.  Musculoskeletal: He exhibits no edema.  See HPI for foot exam if done  Lymphadenopathy:    He has no cervical adenopathy.  Skin: Skin is warm and dry. No rash noted.  Psychiatric: He has a normal mood and affect.  Nursing note and vitals reviewed.  Results for orders placed or performed in visit on 09/24/15  Lipid panel  Result Value Ref Range   Cholesterol 174 0 - 200 mg/dL   Triglycerides (H) 0.0 - 149.0 mg/dL    424.0 Triglyceride is over 400; calculations on Lipids are invalid.   HDL 26.90 (L) >39.00 mg/dL   Total CHOL/HDL Ratio 6   Comprehensive metabolic panel  Result Value Ref Range    Sodium 138 135 - 145 mEq/L   Potassium 4.2 3.5 - 5.1 mEq/L   Chloride 102 96 - 112 mEq/L   CO2 27 19 - 32 mEq/L   Glucose, Bld 142 (H) 70 - 99 mg/dL   BUN 16 6 - 23 mg/dL   Creatinine, Ser 1.04 0.40 - 1.50 mg/dL   Total Bilirubin 0.5 0.2 - 1.2 mg/dL   Alkaline Phosphatase 57 39 - 117 U/L   AST 40 (H) 0 - 37 U/L   ALT 75 (H) 0 - 53 U/L   Total Protein 7.6 6.0 - 8.3 g/dL   Albumin 4.2 3.5 - 5.2 g/dL   Calcium 9.8 8.4 - 10.5 mg/dL   GFR 84.57 >60.00 mL/min  Hemoglobin A1c  Result Value Ref Range   Hgb A1c MFr Bld 8.1 (H) 4.6 - 6.5 %  Uric acid  Result Value Ref Range   Uric Acid, Serum 9.5 (H) 4.0 - 7.8 mg/dL  LDL cholesterol, direct  Result Value Ref Range   Direct LDL 92.0 mg/dL      Assessment & Plan:   Problem List Items Addressed This Visit    Gallbladder polyp    Check RUQ [place] at next CPE.      Hypertriglyceridemia    Update triglycerides. LDL previously controlled on lipitor.      Relevant Orders   Triglycerides   NASH (nonalcoholic steatohepatitis)    Update LFTs and check CBC today.       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 Not checking sugars.  Has declined DSME and pneumovax.  Check A1c today.  Continue glimepiride (rec take with meal) and invokana. Coupon for invokana provided for the new year - tolerating well.       Relevant Medications   canagliflozin (INVOKANA) 300 MG TABS tablet   Other Relevant Orders   Hemoglobin A1c       Follow up plan: No Follow-up on file.  Todd Bush, MD

## 2016-03-20 NOTE — Assessment & Plan Note (Signed)
Update LFTs and check CBC today.

## 2016-03-20 NOTE — Assessment & Plan Note (Signed)
Update triglycerides. LDL previously controlled on lipitor.

## 2016-03-20 NOTE — Assessment & Plan Note (Signed)
Check RUQ [place] at next CPE.

## 2016-03-24 ENCOUNTER — Other Ambulatory Visit: Payer: Self-pay | Admitting: Family Medicine

## 2016-03-24 MED ORDER — ATORVASTATIN CALCIUM 40 MG PO TABS: 40.0000 mg | ORAL_TABLET | Freq: Every day | ORAL | 1 refills | Status: DC

## 2016-03-24 MED ORDER — FENOFIBRATE 145 MG PO TABS: 145.0000 mg | ORAL_TABLET | Freq: Every day | ORAL | 1 refills | Status: DC

## 2016-05-01 ENCOUNTER — Encounter: Payer: Self-pay | Admitting: Family Medicine

## 2016-05-02 MED ORDER — ATORVASTATIN CALCIUM 40 MG PO TABS: 40.0000 mg | ORAL_TABLET | Freq: Every day | ORAL | 1 refills | Status: DC

## 2016-05-02 MED ORDER — FENOFIBRATE 145 MG PO TABS: 145.0000 mg | ORAL_TABLET | Freq: Every day | ORAL | 1 refills | Status: DC

## 2016-07-14 ENCOUNTER — Other Ambulatory Visit: Payer: Self-pay | Admitting: Primary Care

## 2016-07-14 ENCOUNTER — Ambulatory Visit (INDEPENDENT_AMBULATORY_CARE_PROVIDER_SITE_OTHER): Payer: BLUE CROSS/BLUE SHIELD | Admitting: Primary Care

## 2016-07-14 ENCOUNTER — Ambulatory Visit (INDEPENDENT_AMBULATORY_CARE_PROVIDER_SITE_OTHER)
Admission: RE | Admit: 2016-07-14 | Discharge: 2016-07-14 | Disposition: A | Discharge status: Home / Self Care | Payer: BLUE CROSS/BLUE SHIELD | Source: Ambulatory Visit | Attending: Primary Care | Admitting: Primary Care

## 2016-07-14 VITALS — BP 136/86 | HR 105 | Temp 98.0°F | Ht 63.5 in | Wt 209.8 lb

## 2016-07-14 DIAGNOSIS — J069 Acute upper respiratory infection, unspecified: Secondary | ICD-10-CM

## 2016-07-14 MED ORDER — AZITHROMYCIN 250 MG PO TABS: ORAL_TABLET | ORAL | 0 refills | Status: DC

## 2016-07-14 MED ORDER — GUAIFENESIN-CODEINE 100-10 MG/5ML PO SOLN: 5.0000 mL | Freq: Three times a day (TID) | ORAL | 0 refills | Status: DC | PRN

## 2016-07-14 NOTE — Patient Instructions (Signed)
Your symptoms are representative of a viral illness which will resolve on its own over time. Our goal is to treat your symptoms in order to aid your body in the healing process and to make you more comfortable.   Continue Motrin every 8 hours as needed for fevers and chills.  You may take the guaifenesin with codeine cough syrup every 8 hours as needed for cough. Caution as this may cause drowsiness.   Complete xray(s) prior to leaving today. I will notify you of your results once received.  Please call me if no improvement in your symptoms in 2 days.  It was a pleasure meeting you!   Upper Respiratory Infection, Adult Most upper respiratory infections (URIs) are a viral infection of the air passages leading to the lungs. A URI affects the nose, throat, and upper air passages. The most common type of URI is nasopharyngitis and is typically referred to as "the common cold." URIs run their course and usually go away on their own. Most of the time, a URI does not require medical attention, but sometimes a bacterial infection in the upper airways can follow a viral infection. This is called a secondary infection. Sinus and middle ear infections are common types of secondary upper respiratory infections. Bacterial pneumonia can also complicate a URI. A URI can worsen asthma and chronic obstructive pulmonary disease (COPD). Sometimes, these complications can require emergency medical care and may be life threatening. What are the causes? Almost all URIs are caused by viruses. A virus is a type of germ and can spread from one person to another. What increases the risk? You may be at risk for a URI if:  You smoke.  You have chronic heart or lung disease.  You have a weakened defense (immune) system.  You are very young or very old.  You have nasal allergies or asthma.  You work in crowded or poorly ventilated areas.  You work in health care facilities or schools. What are the signs or  symptoms? Symptoms typically develop 2-3 days after you come in contact with a cold virus. Most viral URIs last 7-10 days. However, viral URIs from the influenza virus (flu virus) can last 14-18 days and are typically more severe. Symptoms may include:  Runny or stuffy (congested) nose.  Sneezing.  Cough.  Sore throat.  Headache.  Fatigue.  Fever.  Loss of appetite.  Pain in your forehead, behind your eyes, and over your cheekbones (sinus pain).  Muscle aches. How is this diagnosed? Your health care provider may diagnose a URI by:  Physical exam.  Tests to check that your symptoms are not due to another condition such as:  Strep throat.  Sinusitis.  Pneumonia.  Asthma. How is this treated? A URI goes away on its own with time. It cannot be cured with medicines, but medicines may be prescribed or recommended to relieve symptoms. Medicines may help:  Reduce your fever.  Reduce your cough.  Relieve nasal congestion. Follow these instructions at home:  Take medicines only as directed by your health care provider.  Gargle warm saltwater or take cough drops to comfort your throat as directed by your health care provider.  Use a warm mist humidifier or inhale steam from a shower to increase air moisture. This may make it easier to breathe.  Drink enough fluid to keep your urine clear or pale yellow.  Eat soups and other clear broths and maintain good nutrition.  Rest as needed.  Return to work  when your temperature has returned to normal or as your health care provider advises. You may need to stay home longer to avoid infecting others. You can also use a face mask and careful hand washing to prevent spread of the virus.  Increase the usage of your inhaler if you have asthma.  Do not use any tobacco products, including cigarettes, chewing tobacco, or electronic cigarettes. If you need help quitting, ask your health care provider. How is this prevented? The  best way to protect yourself from getting a cold is to practice good hygiene.  Avoid oral or hand contact with people with cold symptoms.  Wash your hands often if contact occurs. There is no clear evidence that vitamin C, vitamin E, echinacea, or exercise reduces the chance of developing a cold. However, it is always recommended to get plenty of rest, exercise, and practice good nutrition. Contact a health care provider if:  You are getting worse rather than better.  Your symptoms are not controlled by medicine.  You have chills.  You have worsening shortness of breath.  You have brown or red mucus.  You have yellow or brown nasal discharge.  You have pain in your face, especially when you bend forward.  You have a fever.  You have swollen neck glands.  You have pain while swallowing.  You have white areas in the back of your throat. Get help right away if:  You have severe or persistent:  Headache.  Ear pain.  Sinus pain.  Chest pain.  You have chronic lung disease and any of the following:  Wheezing.  Prolonged cough.  Coughing up blood.  A change in your usual mucus.  You have a stiff neck.  You have changes in your:  Vision.  Hearing.  Thinking.  Mood. This information is not intended to replace advice given to you by your health care provider. Make sure you discuss any questions you have with your health care provider. Document Released: 09/03/2000 Document Revised: 11/11/2015 Document Reviewed: 06/15/2013 Elsevier Interactive Patient Education  2017 [employer].

## 2016-07-14 NOTE — Progress Notes (Signed)
Pre visit review using our clinic review tool, if applicable. No additional management support is needed unless otherwise documented below in the visit note. 

## 2016-07-14 NOTE — Progress Notes (Signed)
Subjective:    Patient ID: Todd Braun, male    DOB: Jan 28, 1976, 41 y.o.   MRN: [id number]  HPI  Mr. Creswell is a 41 year old male who presents today with a chief complaint of cough. He also reports fevers, ear pain, sore throat, nasal congestion, sinus pressure. His symptoms began 1 week ago with chills and ear pain. Overall he's feeling better but still coughing and running fevers. His fever last night was 101 and this morning was 99.7. He took Motrin this morning prior to his visit. He's also been taking Mucinex, Sudafed, and Theraflu (day/night) with temporary improvement.   Review of Systems  Constitutional: Positive for chills, fatigue and fever.  HENT: Positive for congestion, ear pain, sinus pressure and sore throat.   Respiratory: Positive for cough. Negative for shortness of breath and wheezing.   Cardiovascular: Negative for chest pain.       Past Medical History:  Diagnosis Date  . Gallbladder polyp 05/2013  . Gout   . HLD (hyperlipidemia)   . NASH (nonalcoholic steatohepatitis) 05/2013   with transaminitis  . Obesity   . T2DM (type 2 diabetes mellitus) ([place])      Social History   Social History  . Marital status: Married    Spouse name: N/A  . Number of children: N/A  . Years of education: N/A   Occupational History  . Not on file.   Social History Main Topics  . Smoking status: Former Research scientist (life sciences)  . Smokeless tobacco: Never Used     Comment: "smoked occasionally a long time ago"  . Alcohol use 0.0 oz/week     Comment: Socially  . Drug use: No  . Sexual activity: Not on file   Other Topics Concern  . Not on file   Social History Narrative   Caffeine: 3-4 cups/night coffee   Lives with wife, son (2005) and twins (2012), 1 outside cat   Occupation: toolmaker   Edu: HS   Activity: walking twice weekly for 30 min   Diet: good water, fruits/vegetables occasionally    Past Surgical History:  Procedure Laterality Date  . APPENDECTOMY  1996  . FOOT SURGERY  2013    right for arthritis  . TONSILLECTOMY  childhood    Family History  Problem Relation Age of Onset  . CAD Neg Hx   . Stroke Neg Hx   . Diabetes Neg Hx   . Cancer Neg Hx     Allergies  Allergen Reactions  . Metformin And Related Diarrhea    IR formulation caused GI upset and diarrhea to point of hematochezia    Current Outpatient Prescriptions on File Prior to Visit  Medication Sig Dispense Refill  . atorvastatin (LIPITOR) 40 MG tablet Take 1 tablet (40 mg total) by mouth daily. 90 tablet 1  . Blood Glucose Monitoring Suppl (BLOOD GLUCOSE METER KIT AND SUPPLIES) Use as instructed 1 each 0  . canagliflozin (INVOKANA) 300 MG TABS tablet Take 1 tablet (300 mg total) by mouth daily. 30 tablet 11  . etodolac (LODINE) 500 MG tablet Take 500 mg by mouth 2 (two) times daily as needed.    . fenofibrate (TRICOR) 145 MG tablet Take 1 tablet (145 mg total) by mouth daily. 90 tablet 1  . glimepiride (AMARYL) 4 MG tablet Take 1 tablet (4 mg total) by mouth 2 (two) times daily. With meals 180 tablet 3  . glucose blood test strip Check 1-2 times daily and PRN E11.40. onetouch ultra meter. [place]  each 3  . Lancets 30G MISC 1 each by Does not apply route daily. 100 each 3  . traMADol (ULTRAM) 50 MG tablet Take 1 tablet (50 mg total) by mouth 2 (two) times daily as needed. 40 tablet 0   No current facility-administered medications on file prior to visit.     BP 136/86   Pulse (!) 105   Temp 98 F (36.7 C) (Oral)   Ht 5' 3.5" (1.613 m)   Wt 209 lb 12.8 oz (95.2 kg)   SpO2 98%   BMI 36.58 kg/m    Objective:   Physical Exam  Constitutional: He appears well-nourished.  HENT:  Right Ear: Tympanic membrane and ear canal normal.  Left Ear: Tympanic membrane and ear canal normal.  Nose: No mucosal edema. Right sinus exhibits no maxillary sinus tenderness and no frontal sinus tenderness. Left sinus exhibits no maxillary sinus tenderness and no frontal sinus tenderness.  Mouth/Throat: Oropharynx  is clear and moist.  Eyes: Conjunctivae are normal.  Neck: Neck supple.  Cardiovascular: Normal rate and regular rhythm.   Sinus tachycardia  Pulmonary/Chest: Effort normal. He has no wheezes. He has rhonchi in the right upper field and the left upper field. He has no rales.  Skin: Skin is warm and dry.          Assessment & Plan:  URI:  Cough, congestion, fevers x 1 week. Some improvement with OTC treatment. Exam today with mild wheezing, sinus tachycardia, suspicious for influenza. If influenza then he is out of the window for Tamiflu treatment. Check xray today given persistent fevers of 101 to rule out early CAP. Rx for guaifenesin with codeine provided today. Discussed to continue Motrin PRN. Fluids, rest, he will call in 2 days if no improvement.  Sheral Flow, NP

## 2016-07-24 LAB — HM DIABETES EYE EXAM

## 2016-07-28 ENCOUNTER — Encounter: Payer: Self-pay | Admitting: Family Medicine

## 2016-09-25 ENCOUNTER — Ambulatory Visit (INDEPENDENT_AMBULATORY_CARE_PROVIDER_SITE_OTHER): Payer: BLUE CROSS/BLUE SHIELD | Admitting: Family Medicine

## 2016-09-25 ENCOUNTER — Encounter: Payer: Self-pay | Admitting: Family Medicine

## 2016-09-25 VITALS — BP 126/86 | HR 88 | Temp 98.6°F | Ht 63.0 in | Wt 210.2 lb

## 2016-09-25 DIAGNOSIS — S2341XA Sprain of ribs, initial encounter: Secondary | ICD-10-CM | POA: Diagnosis not present

## 2016-09-25 DIAGNOSIS — E1165 Type 2 diabetes mellitus with hyperglycemia: Secondary | ICD-10-CM | POA: Diagnosis not present

## 2016-09-25 DIAGNOSIS — E669 Obesity, unspecified: Secondary | ICD-10-CM

## 2016-09-25 DIAGNOSIS — E1140 Type 2 diabetes mellitus with diabetic neuropathy, unspecified: Secondary | ICD-10-CM

## 2016-09-25 DIAGNOSIS — K824 Cholesterolosis of gallbladder: Secondary | ICD-10-CM

## 2016-09-25 DIAGNOSIS — IMO0002 Reserved for concepts with insufficient information to code with codable children: Secondary | ICD-10-CM

## 2016-09-25 DIAGNOSIS — Z0000 Encounter for general adult medical examination without abnormal findings: Secondary | ICD-10-CM

## 2016-09-25 DIAGNOSIS — IMO0001 Reserved for inherently not codable concepts without codable children: Secondary | ICD-10-CM

## 2016-09-25 DIAGNOSIS — K7581 Nonalcoholic steatohepatitis (NASH): Secondary | ICD-10-CM

## 2016-09-25 DIAGNOSIS — E781 Pure hyperglyceridemia: Secondary | ICD-10-CM | POA: Diagnosis not present

## 2016-09-25 LAB — COMPREHENSIVE METABOLIC PANEL
ALT: 60 U/L — ABNORMAL HIGH (ref 0–53)
AST: 30 U/L (ref 0–37)
Albumin: 4.8 g/dL (ref 3.5–5.2)
Alkaline Phosphatase: 57 U/L (ref 39–117)
BUN: 19 mg/dL (ref 6–23)
CO2: 27 mEq/L (ref 19–32)
Calcium: 10.3 mg/dL (ref 8.4–10.5)
Chloride: 102 mEq/L (ref 96–112)
Creatinine, Ser: 1 mg/dL (ref 0.40–1.50)
GFR: 88.03 mL/min (ref >60.00)
Glucose, Bld: 117 mg/dL — ABNORMAL HIGH (ref 70–99)
Potassium: 4.1 mEq/L (ref 3.5–5.1)
Sodium: 139 mEq/L (ref 135–145)
Total Bilirubin: 0.5 mg/dL (ref 0.2–1.2)
Total Protein: 8.6 g/dL — ABNORMAL HIGH (ref 6.0–8.3)

## 2016-09-25 LAB — LIPID PANEL
Cholesterol: 187 mg/dL (ref 0–200)
HDL: 30.1 mg/dL — ABNORMAL LOW (ref >39.00)
NonHDL: 156.99
Total CHOL/HDL Ratio: 6
Triglycerides: 283 mg/dL — ABNORMAL HIGH (ref 0.0–149.0)
VLDL: 56.6 mg/dL — ABNORMAL HIGH (ref 0.0–40.0)

## 2016-09-25 LAB — MICROALBUMIN / CREATININE URINE RATIO
Creatinine,U: 78.4 mg/dL
Microalb Creat Ratio: 1.1 mg/g (ref 0.0–30.0)
Microalb, Ur: 0.9 mg/dL (ref 0.0–1.9)

## 2016-09-25 LAB — TSH: TSH: 1.62 u[IU]/mL (ref 0.35–4.50)

## 2016-09-25 LAB — LDL CHOLESTEROL, DIRECT: Direct LDL: 111 mg/dL

## 2016-09-25 LAB — HEMOGLOBIN A1C: Hgb A1c MFr Bld: 7.8 % — ABNORMAL HIGH (ref 4.6–6.5)

## 2016-09-25 MED ORDER — DAPAGLIFLOZIN PROPANEDIOL 10 MG PO TABS: 10.0000 mg | ORAL_TABLET | Freq: Every day | ORAL | 11 refills | Status: DC

## 2016-09-25 NOTE — Assessment & Plan Note (Signed)
Discussed healthy diet and lifestyle changes to affect sustainable weight loss  

## 2016-09-25 NOTE — Assessment & Plan Note (Signed)
Supportive care reviewed.

## 2016-09-25 NOTE — Assessment & Plan Note (Signed)
Preventative protocols reviewed and updated unless pt declined. Discussed healthy diet and lifestyle.  

## 2016-09-25 NOTE — Assessment & Plan Note (Signed)
Chronic. Update labs today. Pt asks about weekly injection. Discussed invokana and toe amputation association - will price out farxiga, coupon provided today. RTC 6 mo DM f/u visit.

## 2016-09-25 NOTE — Assessment & Plan Note (Signed)
Due for RUQ [place] - ordered today.

## 2016-09-25 NOTE — Assessment & Plan Note (Signed)
Update labs.  

## 2016-09-25 NOTE — Patient Instructions (Addendum)
Labs today. We will be in touch with results and plan for diabetes.  See Todd Braun to schedule ultrasound [facility]?).  Price out farxiga in place of invokana (Rx provided today) You do have left rib strain - should improve with time, use heating pad to area.  Return in 6 months for diabetes follow up   Health Maintenance, Male A healthy lifestyle and preventive care is important for your health and wellness. Ask your health care provider about what schedule of regular examinations is right for you. What should I know about weight and diet? Eat a Healthy Diet  Eat plenty of vegetables, fruits, whole grains, low-fat dairy products, and lean protein.  Do not eat a lot of foods high in solid fats, added sugars, or salt.  Maintain a Healthy Weight Regular exercise can help you achieve or maintain a healthy weight. You should:  Do at least 150 minutes of exercise each week. The exercise should increase your heart rate and make you sweat (moderate-intensity exercise).  Do strength-training exercises at least twice a week.  Watch Your Levels of Cholesterol and Blood Lipids  Have your blood tested for lipids and cholesterol every 5 years starting at 40 years of age. If you are at high risk for heart disease, you should start having your blood tested when you are 40 years old. You may need to have your cholesterol levels checked more often if: ? Your lipid or cholesterol levels are high. ? You are older than 40 years of age. ? You are at high risk for heart disease.  What should I know about cancer screening? Many types of cancers can be detected early and may often be prevented. Lung Cancer  You should be screened every year for lung cancer if: ? You are a current smoker who has smoked for at least 30 years. ? You are a former smoker who has quit within the past 15 years.  Talk to your health care provider about your screening options, when you should start screening, and how often you  should be screened.  Colorectal Cancer  Routine colorectal cancer screening usually begins at 40 years of age and should be repeated every 5-10 years until you are 40 years old. You may need to be screened more often if early forms of precancerous polyps or small growths are found. Your health care provider may recommend screening at an earlier age if you have risk factors for colon cancer.  Your health care provider may recommend using home test kits to check for hidden blood in the stool.  A small camera at the end of a tube can be used to examine your colon (sigmoidoscopy or colonoscopy). This checks for the earliest forms of colorectal cancer.  Prostate and Testicular Cancer  Depending on your age and overall health, your health care provider may do certain tests to screen for prostate and testicular cancer.  Talk to your health care provider about any symptoms or concerns you have about testicular or prostate cancer.  Skin Cancer  Check your skin from head to toe regularly.  Tell your health care provider about any new moles or changes in moles, especially if: ? There is a change in a mole's size, shape, or color. ? You have a mole that is larger than a pencil eraser.  Always use sunscreen. Apply sunscreen liberally and repeat throughout the day.  Protect yourself by wearing long sleeves, pants, a wide-brimmed hat, and sunglasses when outside.  What should I know about  heart disease, diabetes, and high blood pressure?  If you are 73-64 years of age, have your blood pressure checked every 3-5 years. If you are 53 years of age or older, have your blood pressure checked every year. You should have your blood pressure measured twice-once when you are at a hospital or clinic, and once when you are not at a hospital or clinic. Record the average of the two measurements. To check your blood pressure when you are not at a hospital or clinic, you can use: ? An automated blood pressure  machine at a pharmacy. ? A home blood pressure monitor.  Talk to your health care provider about your target blood pressure.  If you are between 69-50 years old, ask your health care provider if you should take aspirin to prevent heart disease.  Have regular diabetes screenings by checking your fasting blood sugar level. ? If you are at a normal weight and have a low risk for diabetes, have this test once every three years after the age of 40. ? If you are overweight and have a high risk for diabetes, consider being tested at a younger age or more often.  A one-time screening for abdominal aortic aneurysm (AAA) by ultrasound is recommended for men aged 41-75 years who are current or former smokers. What should I know about preventing infection? Hepatitis B If you have a higher risk for hepatitis B, you should be screened for this virus. Talk with your health care provider to find out if you are at risk for hepatitis B infection. Hepatitis C Blood testing is recommended for:  Everyone born from [birth year] through 1964.  Anyone with known risk factors for hepatitis C.  Sexually Transmitted Diseases (STDs)  You should be screened each year for STDs including gonorrhea and chlamydia if: ? You are sexually active and are younger than 40 years of age. ? You are older than 40 years of age and your health care provider tells you that you are at risk for this type of infection. ? Your sexual activity has changed since you were last screened and you are at an increased risk for chlamydia or gonorrhea. Ask your health care provider if you are at risk.  Talk with your health care provider about whether you are at high risk of being infected with HIV. Your health care provider may recommend a prescription medicine to help prevent HIV infection.  What else can I do?  Schedule regular health, dental, and eye exams.  Stay current with your vaccines (immunizations).  Do not use any tobacco products,  such as cigarettes, chewing tobacco, and e-cigarettes. If you need help quitting, ask your health care provider.  Limit alcohol intake to no more than 2 drinks per day. One drink equals 12 ounces of beer, 5 ounces of wine, or 1 ounces of hard liquor.  Do not use street drugs.  Do not share needles.  Ask your health care provider for help if you need support or information about quitting drugs.  Tell your health care provider if you often feel depressed.  Tell your health care provider if you have ever been abused or do not feel safe at home. This information is not intended to replace advice given to you by your health care provider. Make sure you discuss any questions you have with your health care provider. Document Released: 09/06/2007 Document Revised: 11/07/2015 Document Reviewed: 12/12/2014 Elsevier Interactive Patient Education  [employer].

## 2016-09-25 NOTE — Progress Notes (Signed)
BP 126/86 (BP Location: Right Arm, Patient Position: Sitting, Cuff Size: Normal)   Pulse 88   Temp 98.6 F (37 C) (Oral)   Ht 5' 3"  (1.6 m)   Wt 210 lb 4 oz (95.4 kg)   SpO2 97%   BMI 37.24 kg/m    CC: CPE Subjective:    Patient ID: Todd Braun, male    DOB: 27-Dec-1975, 41 y.o.   MRN: [id number]  HPI: Todd Braun is a 41 y.o. male presenting on 09/25/2016 for Annual Exam   DM - compliant with invokana and glimepiride. Does not regularly check cbg's.  Gallbladder polyp - due for RUQ [place].  Several week h/o L flank discomfort with coughing.   Preventative:  Flu shot did not receive last year  Tdap 2013 Pneumonia shot - declines Seat belt use discussed Sunscreen use discussed. No changing moles on skin.  Non smoker Alcohol - 1 beer/day   Caffeine: 3-4 cups/night coffee  Lives with wife, son (2005) and twins (2012), 1 outside cat  Occupation: toolmaker  Edu: HS  Activity: no regular exercise  Diet: good water, fruits/vegetables occasionally   Relevant past medical, surgical, family and social history reviewed and updated as indicated. Interim medical history since our last visit reviewed. Allergies and medications reviewed and updated. Outpatient Medications Prior to Visit  Medication Sig Dispense Refill  . atorvastatin (LIPITOR) 40 MG tablet Take 1 tablet (40 mg total) by mouth daily. 90 tablet 1  . Blood Glucose Monitoring Suppl (BLOOD GLUCOSE METER KIT AND SUPPLIES) Use as instructed 1 each 0  . canagliflozin (INVOKANA) 300 MG TABS tablet Take 1 tablet (300 mg total) by mouth daily. 30 tablet 11  . etodolac (LODINE) 500 MG tablet Take 500 mg by mouth 2 (two) times daily as needed.    . fenofibrate (TRICOR) 145 MG tablet Take 1 tablet (145 mg total) by mouth daily. 90 tablet 1  . glimepiride (AMARYL) 4 MG tablet Take 1 tablet (4 mg total) by mouth 2 (two) times daily. With meals 180 tablet 3  . glucose blood test strip Check 1-2 times daily and PRN E11.40. onetouch ultra meter.  180 each 3  . Lancets 30G MISC 1 each by Does not apply route daily. 100 each 3  . traMADol (ULTRAM) 50 MG tablet Take 1 tablet (50 mg total) by mouth 2 (two) times daily as needed. 40 tablet 0  . guaiFENesin-codeine 100-10 MG/5ML syrup Take 5 mLs by mouth every 8 (eight) hours as needed for cough. 75 mL 0  . azithromycin (ZITHROMAX) 250 MG tablet Take 2 tablets by mouth today, then 1 tablet daily for 4 additional days. 6 each 0   No facility-administered medications prior to visit.      Per HPI unless specifically indicated in ROS section below Review of Systems  Constitutional: Negative for activity change, appetite change, chills, fatigue, fever and unexpected weight change.  HENT: Negative for hearing loss.   Eyes: Negative for visual disturbance.  Respiratory: Negative for cough, chest tightness, shortness of breath and wheezing.   Cardiovascular: Negative for chest pain, palpitations and leg swelling.  Gastrointestinal: Negative for abdominal distention, abdominal pain, blood in stool, constipation, diarrhea, nausea and vomiting.  Genitourinary: Negative for difficulty urinating and hematuria.  Musculoskeletal: Negative for arthralgias, myalgias and neck pain.  Skin: Negative for rash.  Neurological: Negative for dizziness, seizures, syncope and headaches.  Hematological: Negative for adenopathy. Does not bruise/bleed easily.  Psychiatric/Behavioral: Negative for dysphoric mood. The patient is not  nervous/anxious.        Objective:    BP 126/86 (BP Location: Right Arm, Patient Position: Sitting, Cuff Size: Normal)   Pulse 88   Temp 98.6 F (37 C) (Oral)   Ht 5' 3"  (1.6 m)   Wt 210 lb 4 oz (95.4 kg)   SpO2 97%   BMI 37.24 kg/m   Wt Readings from Last 3 Encounters:  09/25/16 210 lb 4 oz (95.4 kg)  07/14/16 209 lb 12.8 oz (95.2 kg)  03/20/16 218 lb 8 oz (99.1 kg)    Physical Exam  Constitutional: He is oriented to person, place, and time. He appears well-developed and  well-nourished. No distress.  HENT:  Head: Normocephalic and atraumatic.  Right Ear: Hearing, tympanic membrane, external ear and ear canal normal.  Left Ear: Hearing, tympanic membrane, external ear and ear canal normal.  Nose: Nose normal.  Mouth/Throat: Uvula is midline, oropharynx is clear and moist and mucous membranes are normal. No oropharyngeal exudate, posterior oropharyngeal edema or posterior oropharyngeal erythema.  Eyes: Conjunctivae and EOM are normal. Pupils are equal, round, and reactive to light. No scleral icterus.  Neck: Normal range of motion. Neck supple. No thyromegaly present.  Cardiovascular: Normal rate, regular rhythm, normal heart sounds and intact distal pulses.   No murmur heard. Pulses:      Radial pulses are 2+ on the right side, and 2+ on the left side.  Pulmonary/Chest: Effort normal and breath sounds normal. No respiratory distress. He has no wheezes. He has no rales.  Abdominal: Soft. Bowel sounds are normal. He exhibits no distension and no mass. There is no tenderness. There is no rebound and no guarding.  Reproducible tenderness to palpation left lateral side at lower ribcage  Musculoskeletal: Normal range of motion. He exhibits no edema.  Lymphadenopathy:    He has no cervical adenopathy.  Neurological: He is alert and oriented to person, place, and time.  CN grossly intact, station and gait intact  Skin: Skin is warm and dry. No rash noted.  Psychiatric: He has a normal mood and affect. His behavior is normal. Judgment and thought content normal.  Nursing note and vitals reviewed.  Results for orders placed or performed in visit on 07/28/16  HM DIABETES EYE EXAM  Result Value Ref Range   HM Diabetic Eye Exam No Retinopathy No Retinopathy      Assessment & Plan:   Problem List Items Addressed This Visit    Gallbladder polyp    Due for RUQ [place] - ordered today.       Relevant Orders   US Abdomen Limited RUQ   Comprehensive metabolic panel    Health maintenance examination - Primary    Preventative protocols reviewed and updated unless pt declined. Discussed healthy diet and lifestyle.       Hypertriglyceridemia    Update labs. Continue lipitor and fenofibrate.       Relevant Orders   Lipid panel   TSH   NASH (nonalcoholic steatohepatitis)    Update labs.       Relevant Orders   Comprehensive metabolic panel   TSH   Obesity, Class II, BMI 35-39.9, with comorbidity ([place])    Discussed healthy diet and lifestyle changes to affect sustainable weight loss.       Relevant Medications   dapagliflozin propanediol (FARXIGA) 10 MG TABS tablet   Rib sprain, initial encounter    Supportive care reviewed.      Uncontrolled type 2 diabetes with neuropathy ([place])  Chronic. Update labs today. Pt asks about weekly injection. Discussed invokana and toe amputation association - will price out farxiga, coupon provided today. RTC 6 mo DM f/u visit.       Relevant Medications   dapagliflozin propanediol (FARXIGA) 10 MG TABS tablet   Other Relevant Orders   Hemoglobin A1c   Microalbumin / creatinine urine ratio       Follow up plan: Return in about 6 months (around 03/28/2017) for follow up visit.  Ria Bush, MD

## 2016-09-25 NOTE — Assessment & Plan Note (Addendum)
Update labs. Continue lipitor and fenofibrate.

## 2016-10-02 ENCOUNTER — Ambulatory Visit (INDEPENDENT_AMBULATORY_CARE_PROVIDER_SITE_OTHER): Payer: BLUE CROSS/BLUE SHIELD

## 2016-10-02 DIAGNOSIS — K824 Cholesterolosis of gallbladder: Secondary | ICD-10-CM | POA: Diagnosis not present

## 2016-10-02 DIAGNOSIS — K760 Fatty (change of) liver, not elsewhere classified: Secondary | ICD-10-CM | POA: Diagnosis not present

## 2017-01-21 ENCOUNTER — Other Ambulatory Visit: Payer: Self-pay | Admitting: Family Medicine

## 2017-03-26 ENCOUNTER — Ambulatory Visit (INDEPENDENT_AMBULATORY_CARE_PROVIDER_SITE_OTHER): Payer: BLUE CROSS/BLUE SHIELD | Admitting: Family Medicine

## 2017-03-26 ENCOUNTER — Encounter: Payer: Self-pay | Admitting: Family Medicine

## 2017-03-26 VITALS — BP 118/76 | HR 95 | Temp 98.2°F | Wt 216.0 lb

## 2017-03-26 DIAGNOSIS — K824 Cholesterolosis of gallbladder: Secondary | ICD-10-CM | POA: Diagnosis not present

## 2017-03-26 DIAGNOSIS — IMO0002 Reserved for concepts with insufficient information to code with codable children: Secondary | ICD-10-CM

## 2017-03-26 DIAGNOSIS — E1165 Type 2 diabetes mellitus with hyperglycemia: Secondary | ICD-10-CM | POA: Diagnosis not present

## 2017-03-26 DIAGNOSIS — K7581 Nonalcoholic steatohepatitis (NASH): Secondary | ICD-10-CM

## 2017-03-26 DIAGNOSIS — E781 Pure hyperglyceridemia: Secondary | ICD-10-CM | POA: Diagnosis not present

## 2017-03-26 DIAGNOSIS — E1140 Type 2 diabetes mellitus with diabetic neuropathy, unspecified: Secondary | ICD-10-CM

## 2017-03-26 DIAGNOSIS — M1A9XX0 Chronic gout, unspecified, without tophus (tophi): Secondary | ICD-10-CM | POA: Diagnosis not present

## 2017-03-26 LAB — COMPREHENSIVE METABOLIC PANEL
ALT: 56 U/L — ABNORMAL HIGH (ref 0–53)
AST: 46 U/L — ABNORMAL HIGH (ref 0–37)
Albumin: 4.5 g/dL (ref 3.5–5.2)
Alkaline Phosphatase: 57 U/L (ref 39–117)
BUN: 16 mg/dL (ref 6–23)
CO2: 27 mEq/L (ref 19–32)
Calcium: 9.6 mg/dL (ref 8.4–10.5)
Chloride: 99 mEq/L (ref 96–112)
Creatinine, Ser: 1.03 mg/dL (ref 0.40–1.50)
GFR: 84.86 mL/min (ref >60.00)
Glucose, Bld: 171 mg/dL — ABNORMAL HIGH (ref 70–99)
Potassium: 3.7 mEq/L (ref 3.5–5.1)
Sodium: 136 mEq/L (ref 135–145)
Total Bilirubin: 0.6 mg/dL (ref 0.2–1.2)
Total Protein: 7.7 g/dL (ref 6.0–8.3)

## 2017-03-26 LAB — CBC WITH DIFFERENTIAL/PLATELET
Basophils Absolute: 0 10*3/uL (ref 0.0–0.1)
Basophils Relative: 0.6 % (ref 0.0–3.0)
Eosinophils Absolute: 0.1 10*3/uL (ref 0.0–0.7)
Eosinophils Relative: 2.4 % (ref 0.0–5.0)
HCT: 45.6 % (ref 39.0–52.0)
Hemoglobin: 14.9 g/dL (ref 13.0–17.0)
Lymphocytes Relative: 34.3 % (ref 12.0–46.0)
Lymphs Abs: 2 10*3/uL (ref 0.7–4.0)
MCHC: 32.7 g/dL (ref 30.0–36.0)
MCV: 85.3 fl (ref 78.0–100.0)
Monocytes Absolute: 0.4 10*3/uL (ref 0.1–1.0)
Monocytes Relative: 7 % (ref 3.0–12.0)
Neutro Abs: 3.3 10*3/uL (ref 1.4–7.7)
Neutrophils Relative %: 55.7 % (ref 43.0–77.0)
Platelets: 191 10*3/uL (ref 150.0–400.0)
RBC: 5.34 Mil/uL (ref 4.22–5.81)
RDW: 13.5 % (ref 11.5–15.5)
WBC: 5.9 10*3/uL (ref 4.0–10.5)

## 2017-03-26 LAB — URIC ACID: Uric Acid, Serum: 7.6 mg/dL (ref 4.0–7.8)

## 2017-03-26 LAB — LDL CHOLESTEROL, DIRECT: Direct LDL: 92 mg/dL

## 2017-03-26 LAB — LIPID PANEL
Cholesterol: 199 mg/dL (ref 0–200)
HDL: 24.7 mg/dL — ABNORMAL LOW (ref >39.00)
Total CHOL/HDL Ratio: 8
Triglycerides: 592 mg/dL — ABNORMAL HIGH (ref 0.0–149.0)

## 2017-03-26 LAB — HEMOGLOBIN A1C: Hgb A1c MFr Bld: 10.1 % — ABNORMAL HIGH (ref 4.6–6.5)

## 2017-03-26 NOTE — Assessment & Plan Note (Signed)
Encouraged healthy diet and lifestyle changes to affect sustainable weight loss.  

## 2017-03-26 NOTE — Assessment & Plan Note (Signed)
Chronic, seems to be tolerating farxiga better along with amaryl 30m bid with meals. Not compliant with diabetic diet, resistant to change - continued to encourage healthy changes, reviewed reasons behind working towards better glycemic control.  Declines pneumovax Declines DSME or nutritionist referral.  Check labs today. Discussed if A1c >7%, may recommend addition of 3rd diabetic medication vs focus on implementing healthy diet and lifestyle changes.

## 2017-03-26 NOTE — Progress Notes (Signed)
BP 118/76 (BP Location: Left Arm, Patient Position: Sitting, Cuff Size: Normal)   Pulse 95   Temp 98.2 F (36.8 C) (Oral)   Wt 216 lb (98 kg)   SpO2 95%   BMI 38.26 kg/m    CC: 6 mo f/u visit Subjective:    Patient ID: Todd Braun, male    DOB: 28-Jun-1975, 42 y.o.   MRN: [id number]  HPI: Todd Braun is a 42 y.o. male presenting on 03/26/2017 for 6 mo follow-up   Gallbladder polyp - stable on rpt [place] 2018.   Hypertriglyceridemia - compliant with lipitor and fenofibrate.  Known NASH   DM - does not regularly check sugars. Compliant with antihyperglycemic regimen which includes: farxiga and glimepiride - changed from prior invokana. Tolerating well. Metformin was not tolerated, even low dose or extended release. Denies hypoglycemic symptoms. Denies paresthesias. Last diabetic eye exam 07/2016. Pneumovax: DUE. Prevnar: not due. Glucometer brand: one-touch meter. DSME: declined 2017.  Lab Results  Component Value Date   HGBA1C 7.8 (H) 09/25/2016   Diabetic Foot Exam - Simple   Simple Foot Form Diabetic Foot exam was performed with the following findings:  Yes 03/26/2017 12:25 PM  Visual Inspection No deformities, no ulcerations, no other skin breakdown bilaterally:  Yes Sensation Testing Intact to touch and monofilament testing bilaterally:  Yes Pulse Check Posterior Tibialis and Dorsalis pulse intact bilaterally:  Yes Comments    Lab Results  Component Value Date   MICROALBUR 0.9 09/25/2016     3rd shift - 4:30pm to 1am.   Relevant past medical, surgical, family and social history reviewed and updated as indicated. Interim medical history since our last visit reviewed. Allergies and medications reviewed and updated. Outpatient Medications Prior to Visit  Medication Sig Dispense Refill  . atorvastatin (LIPITOR) 40 MG tablet TAKE 1 TABLET DAILY 90 tablet 1  . Blood Glucose Monitoring Suppl (BLOOD GLUCOSE METER KIT AND SUPPLIES) Use as instructed 1 each 0  . dapagliflozin  propanediol (FARXIGA) 10 MG TABS tablet Take 10 mg by mouth daily. 30 tablet 11  . etodolac (LODINE) 500 MG tablet Take 500 mg by mouth 2 (two) times daily as needed.    . fenofibrate (TRICOR) 145 MG tablet TAKE 1 TABLET DAILY 90 tablet 1  . glimepiride (AMARYL) 4 MG tablet TAKE 1 TABLET TWICE A DAY WITH MEALS 180 tablet 1  . glucose blood test strip Check 1-2 times daily and PRN E11.40. onetouch ultra meter. 180 each 3  . Lancets 30G MISC 1 each by Does not apply route daily. 100 each 3  . traMADol (ULTRAM) 50 MG tablet Take 1 tablet (50 mg total) by mouth 2 (two) times daily as needed. 40 tablet 0  . canagliflozin (INVOKANA) 300 MG TABS tablet Take 1 tablet (300 mg total) by mouth daily. 30 tablet 11   No facility-administered medications prior to visit.      Per HPI unless specifically indicated in ROS section below Review of Systems     Objective:    BP 118/76 (BP Location: Left Arm, Patient Position: Sitting, Cuff Size: Normal)   Pulse 95   Temp 98.2 F (36.8 C) (Oral)   Wt 216 lb (98 kg)   SpO2 95%   BMI 38.26 kg/m   Wt Readings from Last 3 Encounters:  03/26/17 216 lb (98 kg)  09/25/16 210 lb 4 oz (95.4 kg)  07/14/16 209 lb 12.8 oz (95.2 kg)    Physical Exam  Constitutional: He appears well-developed and well-nourished.  No distress.  HENT:  Head: Normocephalic and atraumatic.  Right Ear: External ear normal.  Left Ear: External ear normal.  Nose: Nose normal.  Mouth/Throat: Oropharynx is clear and moist. No oropharyngeal exudate.  Eyes: Conjunctivae and EOM are normal. Pupils are equal, round, and reactive to light. No scleral icterus.  Neck: Normal range of motion. Neck supple.  Cardiovascular: Normal rate, regular rhythm, normal heart sounds and intact distal pulses.  No murmur heard. Pulmonary/Chest: Effort normal and breath sounds normal. No respiratory distress. He has no wheezes. He has no rales.  Musculoskeletal: He exhibits no edema.  See HPI for foot exam  if done  Lymphadenopathy:    He has no cervical adenopathy.  Skin: Skin is warm and dry. No rash noted.  Psychiatric: He has a normal mood and affect.  Nursing note and vitals reviewed.  Results for orders placed or performed in visit on 09/25/16  Lipid panel  Result Value Ref Range   Cholesterol 187 0 - 200 mg/dL   Triglycerides 283.0 (H) 0.0 - 149.0 mg/dL   HDL 30.10 (L) >39.00 mg/dL   VLDL 56.6 (H) 0.0 - 40.0 mg/dL   Total CHOL/HDL Ratio 6    NonHDL 156.99   Comprehensive metabolic panel  Result Value Ref Range   Sodium 139 135 - 145 mEq/L   Potassium 4.1 3.5 - 5.1 mEq/L   Chloride 102 96 - 112 mEq/L   CO2 27 19 - 32 mEq/L   Glucose, Bld 117 (H) 70 - 99 mg/dL   BUN 19 6 - 23 mg/dL   Creatinine, Ser 1.00 0.40 - 1.50 mg/dL   Total Bilirubin 0.5 0.2 - 1.2 mg/dL   Alkaline Phosphatase 57 39 - 117 U/L   AST 30 0 - 37 U/L   ALT 60 (H) 0 - 53 U/L   Total Protein 8.6 (H) 6.0 - 8.3 g/dL   Albumin 4.8 3.5 - 5.2 g/dL   Calcium 10.3 8.4 - 10.5 mg/dL   GFR 88.03 >60.00 mL/min  TSH  Result Value Ref Range   TSH 1.62 0.35 - 4.50 uIU/mL  Hemoglobin A1c  Result Value Ref Range   Hgb A1c MFr Bld 7.8 (H) 4.6 - 6.5 %  Microalbumin / creatinine urine ratio  Result Value Ref Range   Microalb, Ur 0.9 0.0 - 1.9 mg/dL   Creatinine,U 78.4 mg/dL   Microalb Creat Ratio 1.1 0.0 - 30.0 mg/g  LDL cholesterol, direct  Result Value Ref Range   Direct LDL 111.0 mg/dL      Assessment & Plan:   Problem List Items Addressed This Visit    Gallbladder polyp   Gout   Relevant Orders   Uric acid   Hypertriglyceridemia    Compliant with tricor and lipitor. Update FLP today.       Relevant Orders   Lipid panel   Comprehensive metabolic panel   NASH (nonalcoholic steatohepatitis)   Relevant Orders   Lipid panel   Comprehensive metabolic panel   CBC with Differential/Platelet   Severe obesity (BMI 35.0-39.9) with comorbidity ([place])    Encouraged healthy diet and lifestyle changes to affect  sustainable weight loss.       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 Not compliant with diabetic diet, resistant to change - continued to encourage healthy changes, reviewed reasons behind working towards better glycemic control.  Declines pneumovax Declines DSME or  nutritionist referral.  Check labs today. Discussed if A1c >7%, may recommend addition of 3rd diabetic medication vs focus on implementing healthy diet and lifestyle changes.       Relevant Orders   Hemoglobin A1c       Follow up plan: Return in about 6 months (around 09/23/2017) for annual exam, prior fasting for blood work.  Ria Bush, MD

## 2017-03-26 NOTE — Assessment & Plan Note (Signed)
Compliant with tricor and lipitor. Update FLP today.

## 2017-03-26 NOTE — Patient Instructions (Addendum)
Think about pneumonia and flu shots.  I recommend pneumonia shot for all diabetics.  Continue current medicines - we will be in touch with labwork results.  Labs today.

## 2017-03-28 ENCOUNTER — Other Ambulatory Visit: Payer: Self-pay | Admitting: Family Medicine

## 2017-03-28 MED ORDER — PIOGLITAZONE HCL 30 MG PO TABS: 30.0000 mg | ORAL_TABLET | Freq: Every day | ORAL | 6 refills | Status: DC

## 2017-07-28 LAB — HM DIABETES EYE EXAM

## 2017-08-05 ENCOUNTER — Encounter: Payer: Self-pay | Admitting: Family Medicine

## 2017-09-07 ENCOUNTER — Other Ambulatory Visit: Payer: Self-pay | Admitting: Family Medicine

## 2017-10-01 ENCOUNTER — Other Ambulatory Visit: Payer: Self-pay | Admitting: Family Medicine

## 2017-10-01 ENCOUNTER — Encounter: Payer: Self-pay | Admitting: Family Medicine

## 2017-10-05 ENCOUNTER — Encounter: Payer: Self-pay | Admitting: Family Medicine

## 2017-10-05 ENCOUNTER — Ambulatory Visit (INDEPENDENT_AMBULATORY_CARE_PROVIDER_SITE_OTHER): Payer: BLUE CROSS/BLUE SHIELD | Admitting: Family Medicine

## 2017-10-05 VITALS — BP 120/80 | HR 84 | Temp 97.8°F | Ht 63.0 in | Wt 220.0 lb

## 2017-10-05 DIAGNOSIS — E781 Pure hyperglyceridemia: Secondary | ICD-10-CM

## 2017-10-05 DIAGNOSIS — K824 Cholesterolosis of gallbladder: Secondary | ICD-10-CM | POA: Diagnosis not present

## 2017-10-05 DIAGNOSIS — M1A9XX0 Chronic gout, unspecified, without tophus (tophi): Secondary | ICD-10-CM | POA: Diagnosis not present

## 2017-10-05 DIAGNOSIS — Z0000 Encounter for general adult medical examination without abnormal findings: Secondary | ICD-10-CM

## 2017-10-05 DIAGNOSIS — E1165 Type 2 diabetes mellitus with hyperglycemia: Secondary | ICD-10-CM | POA: Diagnosis not present

## 2017-10-05 DIAGNOSIS — K7581 Nonalcoholic steatohepatitis (NASH): Secondary | ICD-10-CM | POA: Diagnosis not present

## 2017-10-05 DIAGNOSIS — IMO0002 Reserved for concepts with insufficient information to code with codable children: Secondary | ICD-10-CM

## 2017-10-05 DIAGNOSIS — E1140 Type 2 diabetes mellitus with diabetic neuropathy, unspecified: Secondary | ICD-10-CM | POA: Diagnosis not present

## 2017-10-05 LAB — HEMOGLOBIN A1C: Hgb A1c MFr Bld: 8 % — ABNORMAL HIGH (ref 4.6–6.5)

## 2017-10-05 LAB — POC URINALSYSI DIPSTICK (AUTOMATED)
Bilirubin, UA: NEGATIVE
Blood, UA: NEGATIVE
Glucose, UA: POSITIVE — AB
Ketones, UA: NEGATIVE
Leukocytes, UA: NEGATIVE
Nitrite, UA: NEGATIVE
Protein, UA: NEGATIVE
Spec Grav, UA: 1.02 (ref 1.010–1.025)
Urobilinogen, UA: 0.2 E.U./dL
pH, UA: 6 (ref 5.0–8.0)

## 2017-10-05 LAB — LIPID PANEL
Cholesterol: 158 mg/dL (ref 0–200)
HDL: 34.7 mg/dL — ABNORMAL LOW (ref >39.00)
LDL Cholesterol: 89 mg/dL (ref 0–99)
NonHDL: 122.86
Total CHOL/HDL Ratio: 5
Triglycerides: 170 mg/dL — ABNORMAL HIGH (ref 0.0–149.0)
VLDL: 34 mg/dL (ref 0.0–40.0)

## 2017-10-05 LAB — COMPREHENSIVE METABOLIC PANEL
ALT: 45 U/L (ref 0–53)
AST: 24 U/L (ref 0–37)
Albumin: 4.7 g/dL (ref 3.5–5.2)
Alkaline Phosphatase: 49 U/L (ref 39–117)
BUN: 20 mg/dL (ref 6–23)
CO2: 25 mEq/L (ref 19–32)
Calcium: 9.6 mg/dL (ref 8.4–10.5)
Chloride: 105 mEq/L (ref 96–112)
Creatinine, Ser: 1.02 mg/dL (ref 0.40–1.50)
GFR: 85.59 mL/min (ref >60.00)
Glucose, Bld: 107 mg/dL — ABNORMAL HIGH (ref 70–99)
Potassium: 3.8 mEq/L (ref 3.5–5.1)
Sodium: 140 mEq/L (ref 135–145)
Total Bilirubin: 0.5 mg/dL (ref 0.2–1.2)
Total Protein: 8 g/dL (ref 6.0–8.3)

## 2017-10-05 LAB — URIC ACID: Uric Acid, Serum: 5.8 mg/dL (ref 4.0–7.8)

## 2017-10-05 LAB — MICROALBUMIN / CREATININE URINE RATIO
Creatinine,U: 73.2 mg/dL
Microalb Creat Ratio: 1 mg/g (ref 0.0–30.0)
Microalb, Ur: <0.7 mg/dL (ref 0.0–1.9)

## 2017-10-05 MED ORDER — DAPAGLIFLOZIN PROPANEDIOL 10 MG PO TABS: 10.0000 mg | ORAL_TABLET | Freq: Every day | ORAL | 11 refills | Status: DC

## 2017-10-05 MED ORDER — FENOFIBRATE 145 MG PO TABS: 145.0000 mg | ORAL_TABLET | Freq: Every day | ORAL | 3 refills | Status: DC

## 2017-10-05 MED ORDER — ATORVASTATIN CALCIUM 40 MG PO TABS
40.0000 mg | ORAL_TABLET | Freq: Every day | ORAL | 3 refills | Status: DC
Start: 2017-10-05 — End: 2018-04-13

## 2017-10-05 MED ORDER — PIOGLITAZONE HCL 30 MG PO TABS: 30.0000 mg | ORAL_TABLET | Freq: Every day | ORAL | 3 refills | Status: DC

## 2017-10-05 NOTE — Assessment & Plan Note (Signed)
Update urate.

## 2017-10-05 NOTE — Assessment & Plan Note (Signed)
Chronic, stable on tricor/lipitor. Update labs. The 10-year ASCVD risk score Mikey Bussing DC Brooke Bonito., et al., 2013) is: 5.2%   Values used to calculate the score:     Age: 42 years     Sex: Male     Is Non-Hispanic African American: No     Diabetic: Yes     Tobacco smoker: No     Systolic Blood Pressure: 357 mmHg     Is BP treated: No     HDL Cholesterol: 24.7 mg/dL     Total Cholesterol: 199 mg/dL

## 2017-10-05 NOTE — Addendum Note (Signed)
Addended by: Brenton Grills on: 5/64/3329 51:88 AM   Modules accepted: Orders

## 2017-10-05 NOTE — Assessment & Plan Note (Signed)
Update labs on farxiga, amaryl, actos (latest addition). Weight gain noted.  1 yr on farxiga - will see if eligible to repeat coupon.

## 2017-10-05 NOTE — Progress Notes (Signed)
BP 120/80 (BP Location: Left Arm, Patient Position: Sitting, Cuff Size: Large)   Pulse 84   Temp 97.8 F (36.6 C) (Oral)   Ht 5' 3" (1.6 m)   Wt 220 lb (99.8 kg)   SpO2 96%   BMI 38.97 kg/m    CC: CPE Subjective:    Patient ID: Todd Braun, male    DOB: Nov 30, 1975, 42 y.o.   MRN: [id number]  HPI: Todd Braun is a 42 y.o. male presenting on 10/05/2017 for Annual Exam   DM - latest started actos. Doesn't regularly check sugars. Weight gain noted.  Lab Results  Component Value Date   HGBA1C 10.1 (H) 03/26/2017    Preventative:  Flu shot declines Tdap 2013 Pneumonia shot - declines Seat belt use discussed Sunscreen use discussed. No changing moles on skin.  Non smoker Alcohol - 1 beer/day  Dentist - Q6 mo Eye exam - yearly  Caffeine: 3-4 cups/night coffee  Lives with wife, son (2005) and twins (2012), 1 outside cat  Occupation: toolmaker  Edu: HS  Activity: no regular exercise  Diet: good water, fruits/vegetables occasionally   Relevant past medical, surgical, family and social history reviewed and updated as indicated. Interim medical history since our last visit reviewed. Allergies and medications reviewed and updated. Outpatient Medications Prior to Visit  Medication Sig Dispense Refill  . Blood Glucose Monitoring Suppl (BLOOD GLUCOSE METER KIT AND SUPPLIES) Use as instructed 1 each 0  . etodolac (LODINE) 500 MG tablet Take 500 mg by mouth 2 (two) times daily as needed.    Marland Kitchen glimepiride (AMARYL) 4 MG tablet TAKE 1 TABLET TWICE A DAY WITH MEALS 180 tablet 1  . glucose blood test strip Check 1-2 times daily and PRN E11.40. onetouch ultra meter. 180 each 3  . Lancets 30G MISC 1 each by Does not apply route daily. 100 each 3  . traMADol (ULTRAM) 50 MG tablet Take 1 tablet (50 mg total) by mouth 2 (two) times daily as needed. 40 tablet 0  . atorvastatin (LIPITOR) 40 MG tablet TAKE 1 TABLET DAILY 90 tablet 0  . dapagliflozin propanediol (FARXIGA) 10 MG TABS tablet Take 10 mg  by mouth daily. 30 tablet 11  . fenofibrate (TRICOR) 145 MG tablet TAKE 1 TABLET DAILY 90 tablet 0  . pioglitazone (ACTOS) 30 MG tablet TAKE 1 TABLET BY MOUTH EVERY DAY 30 tablet 0   No facility-administered medications prior to visit.      Per HPI unless specifically indicated in ROS section below Review of Systems  Constitutional: Negative for activity change, appetite change, chills, fatigue, fever and unexpected weight change.  HENT: Negative for hearing loss.   Eyes: Negative for visual disturbance.  Respiratory: Negative for cough, chest tightness, shortness of breath and wheezing.   Cardiovascular: Negative for chest pain, palpitations and leg swelling.  Gastrointestinal: Positive for abdominal pain (some R flank pain), blood in stool (with wiping) and constipation. Negative for abdominal distention, diarrhea, nausea and vomiting.  Genitourinary: Negative for difficulty urinating and hematuria.  Musculoskeletal: Negative for arthralgias, myalgias and neck pain.  Skin: Negative for rash.  Neurological: Negative for dizziness, seizures, syncope and headaches.  Hematological: Negative for adenopathy. Does not bruise/bleed easily.  Psychiatric/Behavioral: Negative for dysphoric mood. The patient is not nervous/anxious.        Objective:    BP 120/80 (BP Location: Left Arm, Patient Position: Sitting, Cuff Size: Large)   Pulse 84   Temp 97.8 F (36.6 C) (Oral)   Ht 5'  3" (1.6 m)   Wt 220 lb (99.8 kg)   SpO2 96%   BMI 38.97 kg/m   Wt Readings from Last 3 Encounters:  10/05/17 220 lb (99.8 kg)  03/26/17 216 lb (98 kg)  09/25/16 210 lb 4 oz (95.4 kg)    Physical Exam  Constitutional: He is oriented to person, place, and time. He appears well-developed and well-nourished. No distress.  HENT:  Head: Normocephalic and atraumatic.  Right Ear: Hearing, tympanic membrane, external ear and ear canal normal.  Left Ear: Hearing, tympanic membrane, external ear and ear canal normal.    Nose: Nose normal.  Mouth/Throat: Uvula is midline, oropharynx is clear and moist and mucous membranes are normal. No oropharyngeal exudate, posterior oropharyngeal edema or posterior oropharyngeal erythema.  Eyes: Pupils are equal, round, and reactive to light. Conjunctivae and EOM are normal. No scleral icterus.  Neck: Normal range of motion. Neck supple.  Cardiovascular: Normal rate, regular rhythm, normal heart sounds and intact distal pulses.  No murmur heard. Pulses:      Radial pulses are 2+ on the right side, and 2+ on the left side.  Pulmonary/Chest: Effort normal and breath sounds normal. No respiratory distress. He has no wheezes. He has no rales.  Abdominal: Soft. Bowel sounds are normal. He exhibits no distension and no mass. There is no tenderness. There is no rebound and no guarding.  Musculoskeletal: Normal range of motion. He exhibits no edema.  Lymphadenopathy:    He has no cervical adenopathy.  Neurological: He is alert and oriented to person, place, and time.  CN grossly intact, station and gait intact  Skin: Skin is warm and dry. No rash noted.  Psychiatric: He has a normal mood and affect. His behavior is normal. Judgment and thought content normal.  Nursing note and vitals reviewed.  Results for orders placed or performed in visit on 08/05/17  HM DIABETES EYE EXAM  Result Value Ref Range   HM Diabetic Eye Exam No Retinopathy No Retinopathy      Assessment & Plan:   Problem List Items Addressed This Visit    Uncontrolled type 2 diabetes with neuropathy ([place])    Update labs on farxiga, amaryl, actos (latest addition). Weight gain noted.  1 yr on farxiga - will see if eligible to repeat coupon.       Relevant Medications   atorvastatin (LIPITOR) 40 MG tablet   dapagliflozin propanediol (FARXIGA) 10 MG TABS tablet   pioglitazone (ACTOS) 30 MG tablet   Other Relevant Orders   Hemoglobin A1c   Microalbumin / creatinine urine ratio   Severe obesity (BMI  35.0-39.9) with comorbidity ([place])    Has decreased sugar intake most recently.       Relevant Medications   dapagliflozin propanediol (FARXIGA) 10 MG TABS tablet   pioglitazone (ACTOS) 30 MG tablet   NASH (nonalcoholic steatohepatitis)    Update LFTs. abd [place] from 09/2016 - stable gallbladder polyps with fatty liver.       Relevant Orders   Comprehensive metabolic panel   Hypertriglyceridemia    Chronic, stable on tricor/lipitor. Update labs. The 10-year ASCVD risk score [name], et al., 2013) is: 5.2%   Values used to calculate the score:     Age: 47 years     Sex: Male     Is Non-Hispanic African American: No     Diabetic: Yes     Tobacco smoker: No     Systolic Blood Pressure: 001 mmHg  Is BP treated: No     HDL Cholesterol: 24.7 mg/dL     Total Cholesterol: 199 mg/dL       Relevant Medications   atorvastatin (LIPITOR) 40 MG tablet   fenofibrate (TRICOR) 145 MG tablet   Other Relevant Orders   Lipid panel   Comprehensive metabolic panel   Health maintenance examination - Primary    Preventative protocols reviewed and updated unless pt declined. Discussed healthy diet and lifestyle.       Gout    Update urate.       Relevant Orders   Uric acid   Gallbladder polyp    Update LFTs. abd [place] from 09/2016 - stable gallbladder polyps with fatty liver.           Meds ordered this encounter  Medications  . atorvastatin (LIPITOR) 40 MG tablet    Sig: Take 1 tablet (40 mg total) by mouth daily.    Dispense:  90 tablet    Refill:  3  . fenofibrate (TRICOR) 145 MG tablet    Sig: Take 1 tablet (145 mg total) by mouth daily.    Dispense:  90 tablet    Refill:  3  . dapagliflozin propanediol (FARXIGA) 10 MG TABS tablet    Sig: Take 10 mg by mouth daily.    Dispense:  30 tablet    Refill:  11  . pioglitazone (ACTOS) 30 MG tablet    Sig: Take 1 tablet (30 mg total) by mouth daily.    Dispense:  90 tablet    Refill:  3   Orders Placed This Encounter    Procedures  . Lipid panel  . Comprehensive metabolic panel  . Hemoglobin A1c  . Microalbumin / creatinine urine ratio  . Uric acid    Follow up plan: Return in about 6 months (around 04/07/2018) for follow up visit.  Ria Bush, MD

## 2017-10-05 NOTE — Assessment & Plan Note (Signed)
Preventative protocols reviewed and updated unless pt declined. Discussed healthy diet and lifestyle.  

## 2017-10-05 NOTE — Patient Instructions (Addendum)
Labs and urinalysis today Return in 6 months for follow up visit.   Health Maintenance, Male A healthy lifestyle and preventive care is important for your health and wellness. Ask your health care provider about what schedule of regular examinations is right for you. What should I know about weight and diet? Eat a Healthy Diet  Eat plenty of vegetables, fruits, whole grains, low-fat dairy products, and lean protein.  Do not eat a lot of foods high in solid fats, added sugars, or salt.  Maintain a Healthy Weight Regular exercise can help you achieve or maintain a healthy weight. You should:  Do at least 150 minutes of exercise each week. The exercise should increase your heart rate and make you sweat (moderate-intensity exercise).  Do strength-training exercises at least twice a week.  Watch Your Levels of Cholesterol and Blood Lipids  Have your blood tested for lipids and cholesterol every 5 years starting at 41 years of age. If you are at high risk for heart disease, you should start having your blood tested when you are 41 years old. You may need to have your cholesterol levels checked more often if: ? Your lipid or cholesterol levels are high. ? You are older than 41 years of age. ? You are at high risk for heart disease.  What should I know about cancer screening? Many types of cancers can be detected early and may often be prevented. Lung Cancer  You should be screened every year for lung cancer if: ? You are a current smoker who has smoked for at least 30 years. ? You are a former smoker who has quit within the past 15 years.  Talk to your health care provider about your screening options, when you should start screening, and how often you should be screened.  Colorectal Cancer  Routine colorectal cancer screening usually begins at 41 years of age and should be repeated every 5-10 years until you are 41 years old. You may need to be screened more often if early forms of  precancerous polyps or small growths are found. Your health care provider may recommend screening at an earlier age if you have risk factors for colon cancer.  Your health care provider may recommend using home test kits to check for hidden blood in the stool.  A small camera at the end of a tube can be used to examine your colon (sigmoidoscopy or colonoscopy). This checks for the earliest forms of colorectal cancer.  Prostate and Testicular Cancer  Depending on your age and overall health, your health care provider may do certain tests to screen for prostate and testicular cancer.  Talk to your health care provider about any symptoms or concerns you have about testicular or prostate cancer.  Skin Cancer  Check your skin from head to toe regularly.  Tell your health care provider about any new moles or changes in moles, especially if: ? There is a change in a mole's size, shape, or color. ? You have a mole that is larger than a pencil eraser.  Always use sunscreen. Apply sunscreen liberally and repeat throughout the day.  Protect yourself by wearing long sleeves, pants, a wide-brimmed hat, and sunglasses when outside.  What should I know about heart disease, diabetes, and high blood pressure?  If you are 29-70 years of age, have your blood pressure checked every 3-5 years. If you are 77 years of age or older, have your blood pressure checked every year. You should have your blood  pressure measured twice-once when you are at a hospital or clinic, and once when you are not at a hospital or clinic. Record the average of the two measurements. To check your blood pressure when you are not at a hospital or clinic, you can use: ? An automated blood pressure machine at a pharmacy. ? A home blood pressure monitor.  Talk to your health care provider about your target blood pressure.  If you are between 18-40 years old, ask your health care provider if you should take aspirin to prevent heart  disease.  Have regular diabetes screenings by checking your fasting blood sugar level. ? If you are at a normal weight and have a low risk for diabetes, have this test once every three years after the age of 40. ? If you are overweight and have a high risk for diabetes, consider being tested at a younger age or more often.  A one-time screening for abdominal aortic aneurysm (AAA) by ultrasound is recommended for men aged 19-75 years who are current or former smokers. What should I know about preventing infection? Hepatitis B If you have a higher risk for hepatitis B, you should be screened for this virus. Talk with your health care provider to find out if you are at risk for hepatitis B infection. Hepatitis C Blood testing is recommended for:  Everyone born from [birth year] through 1964.  Anyone with known risk factors for hepatitis C.  Sexually Transmitted Diseases (STDs)  You should be screened each year for STDs including gonorrhea and chlamydia if: ? You are sexually active and are younger than 41 years of age. ? You are older than 41 years of age and your health care provider tells you that you are at risk for this type of infection. ? Your sexual activity has changed since you were last screened and you are at an increased risk for chlamydia or gonorrhea. Ask your health care provider if you are at risk.  Talk with your health care provider about whether you are at high risk of being infected with HIV. Your health care provider may recommend a prescription medicine to help prevent HIV infection.  What else can I do?  Schedule regular health, dental, and eye exams.  Stay current with your vaccines (immunizations).  Do not use any tobacco products, such as cigarettes, chewing tobacco, and e-cigarettes. If you need help quitting, ask your health care provider.  Limit alcohol intake to no more than 2 drinks per day. One drink equals 12 ounces of beer, 5 ounces of wine, or 1 ounces of  hard liquor.  Do not use street drugs.  Do not share needles.  Ask your health care provider for help if you need support or information about quitting drugs.  Tell your health care provider if you often feel depressed.  Tell your health care provider if you have ever been abused or do not feel safe at home. This information is not intended to replace advice given to you by your health care provider. Make sure you discuss any questions you have with your health care provider. Document Released: 09/06/2007 Document Revised: 11/07/2015 Document Reviewed: 12/12/2014 Elsevier Interactive Patient Education  [employer].

## 2017-10-05 NOTE — Assessment & Plan Note (Signed)
Has decreased sugar intake most recently.

## 2017-10-05 NOTE — Assessment & Plan Note (Signed)
Update LFTs. abd [place] from 09/2016 - stable gallbladder polyps with fatty liver.

## 2017-10-10 ENCOUNTER — Encounter: Payer: Self-pay | Admitting: Family Medicine

## 2017-11-30 ENCOUNTER — Other Ambulatory Visit: Payer: Self-pay | Admitting: Family Medicine

## 2018-03-10 IMAGING — US US ABDOMEN LIMITED
1 series · 14 of 25 positions shown · non-contrast
Comparison: Right upper quadrant ultrasound 01/31/2015.

CLINICAL DATA: History of gallbladder polyps. Subsequent encounter.

EXAM:
ULTRASOUND ABDOMEN LIMITED RIGHT UPPER QUADRANT

[Series 1: us abdomen limited · 0.13mm/px · 14 of 44 slices shown]
[im 1/44]
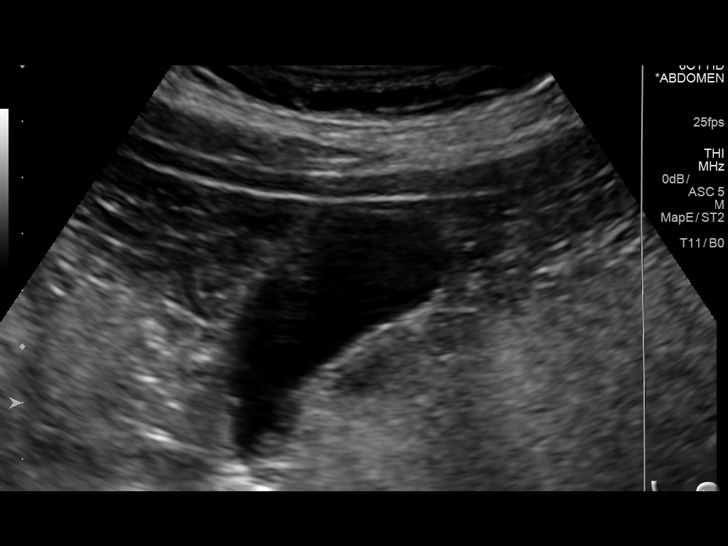
[im 4/44]
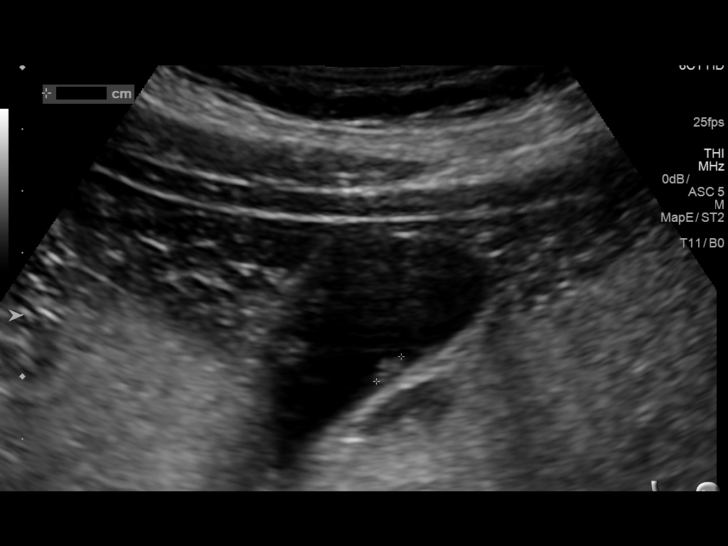
[im 8/44]
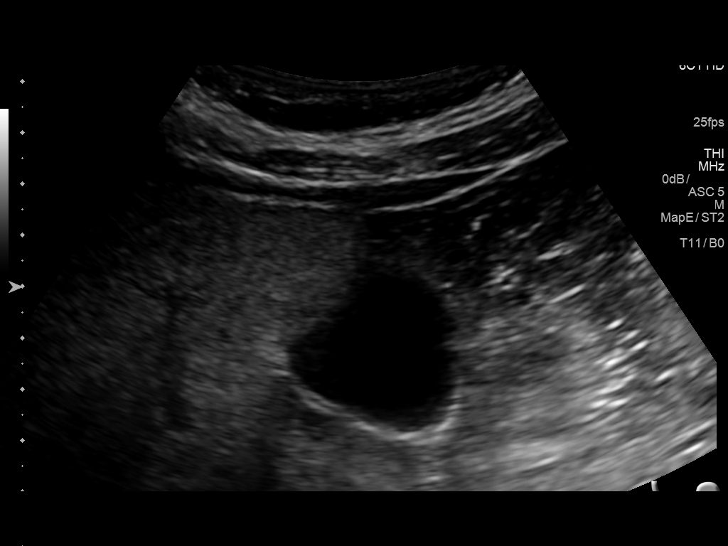
[im 11/44]
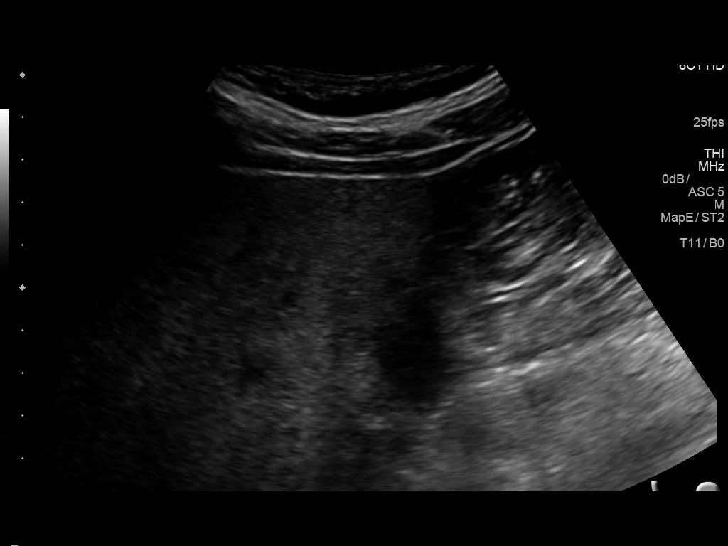
[im 15/44]
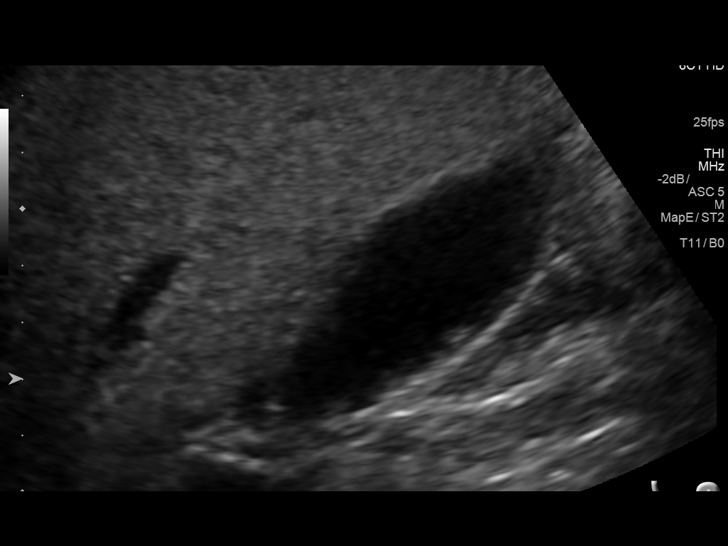
[im 17/44]
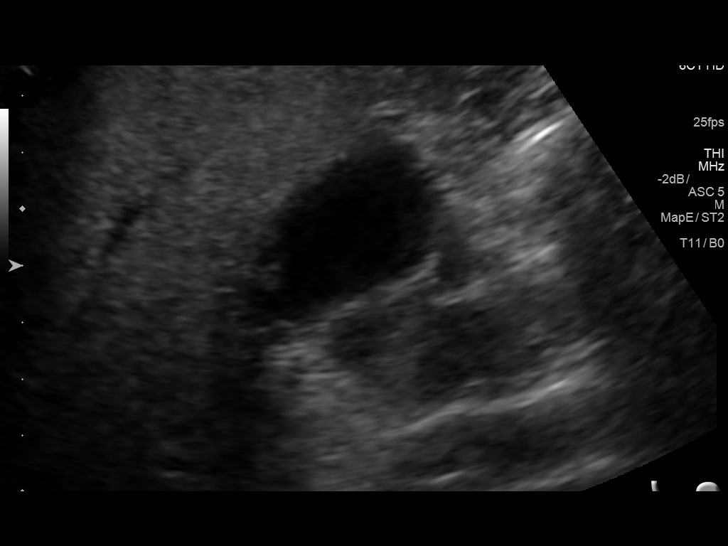
[im 20/44]
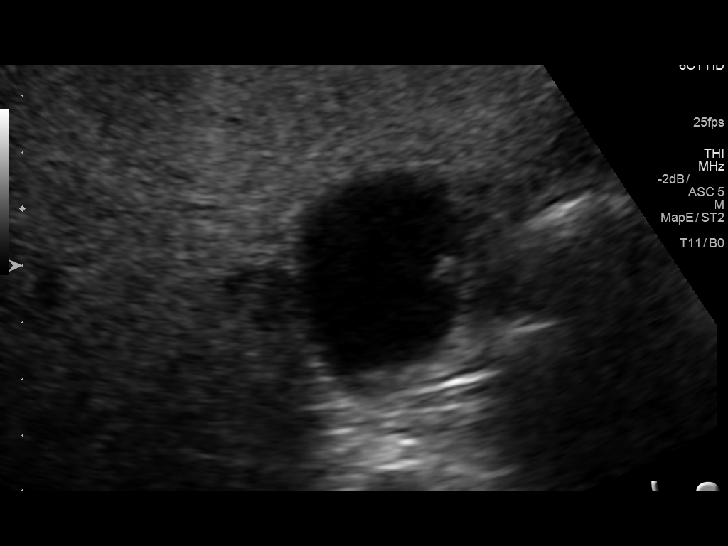
[im 24/44]
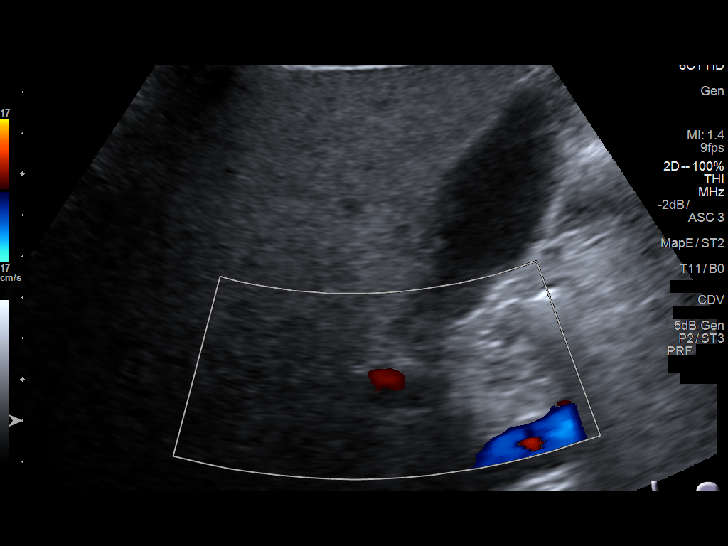
[im 27/44]
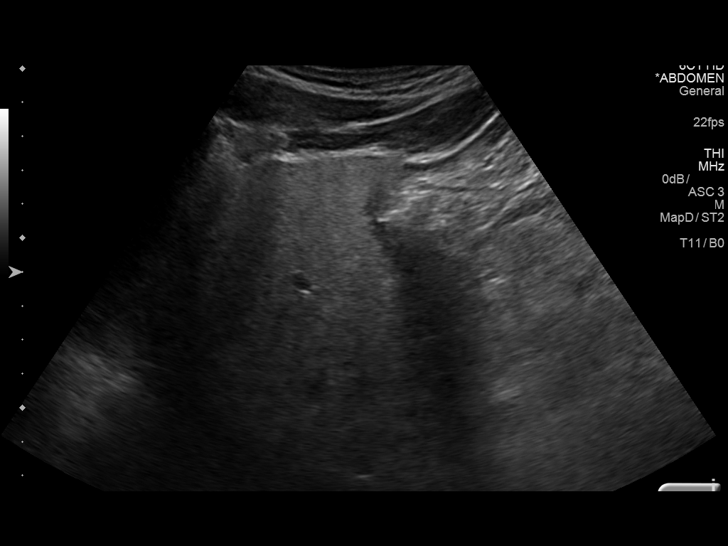
[im 29/44]
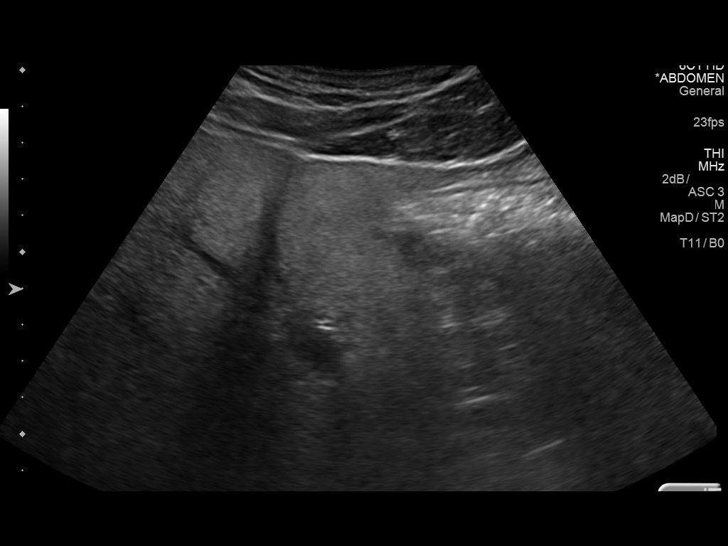
[im 33/44]
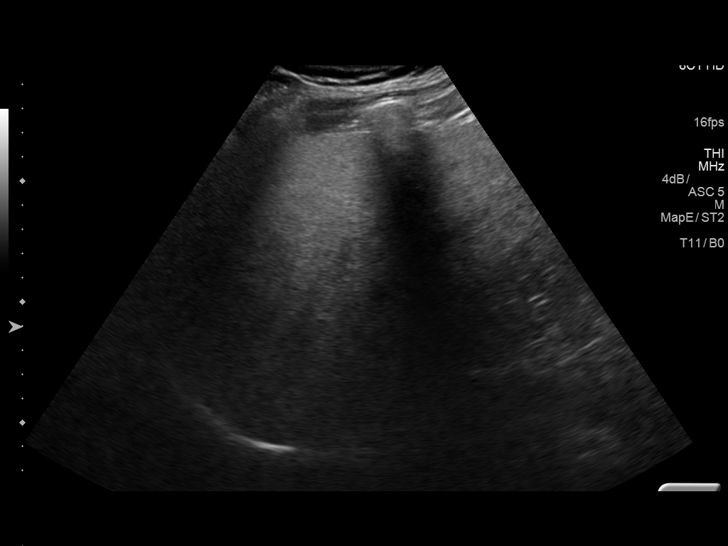
[im 36/44]
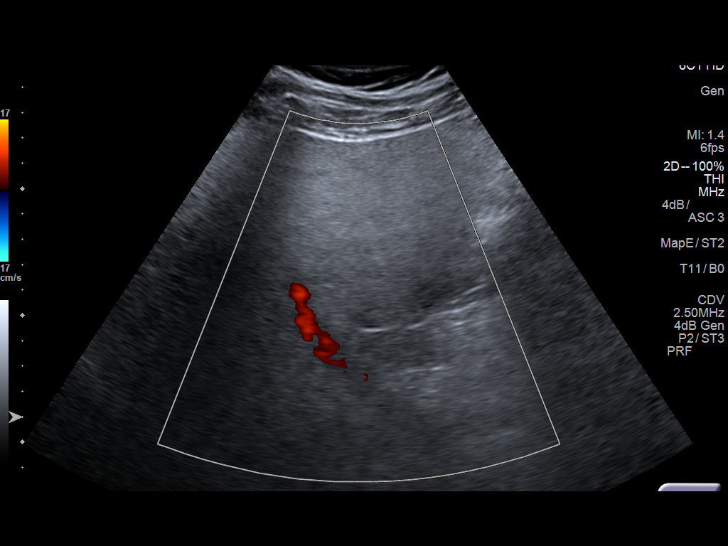
[im 40/44]
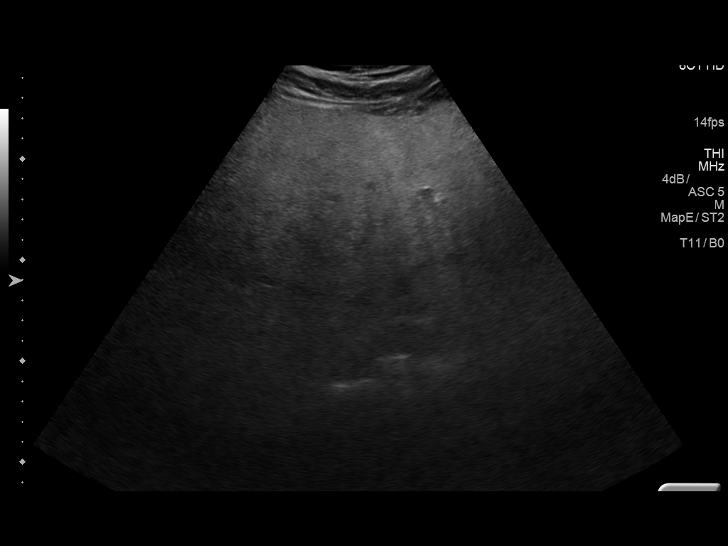
[im 44/44]
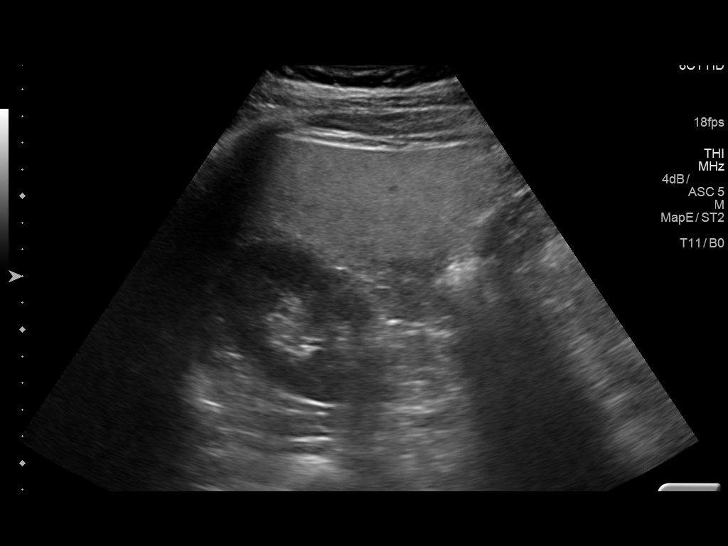

[14 of 25 positions shown; findings below may reference images not displayed]

FINDINGS: Gallbladder:

Multiple gallbladder polyps are again identified. Largest measured
polyp on today's study is 0.7 cm. No gallstones, gallbladder wall
thickening or pericholecystic fluid. Sonographer reports negative
Murphy's sign.

Common bile duct:

Diameter: 0.4 cm.

Liver:

Echogenicity is diffusely increased consistent with fatty
infiltration. No focal lesion or intrahepatic biliary ductal
dilatation.
IMPRESSION: No notable change of multiple gallbladder polyps. On today's study,
largest visualized polyp measures 0.7 cm compared to 0.9 cm on the
prior exam.

Diffuse fatty infiltration of the liver.

## 2018-03-10 IMAGING — DX DG CHEST 2V
2 series · 2 of 2 positions shown · non-contrast
Comparison: None

CLINICAL DATA: Cough, wheezing, fever to 101 degrees, symptoms for
1 week, acute upper respiratory infection question pneumonia

EXAM:
CHEST  2 VIEW

[chest pa]
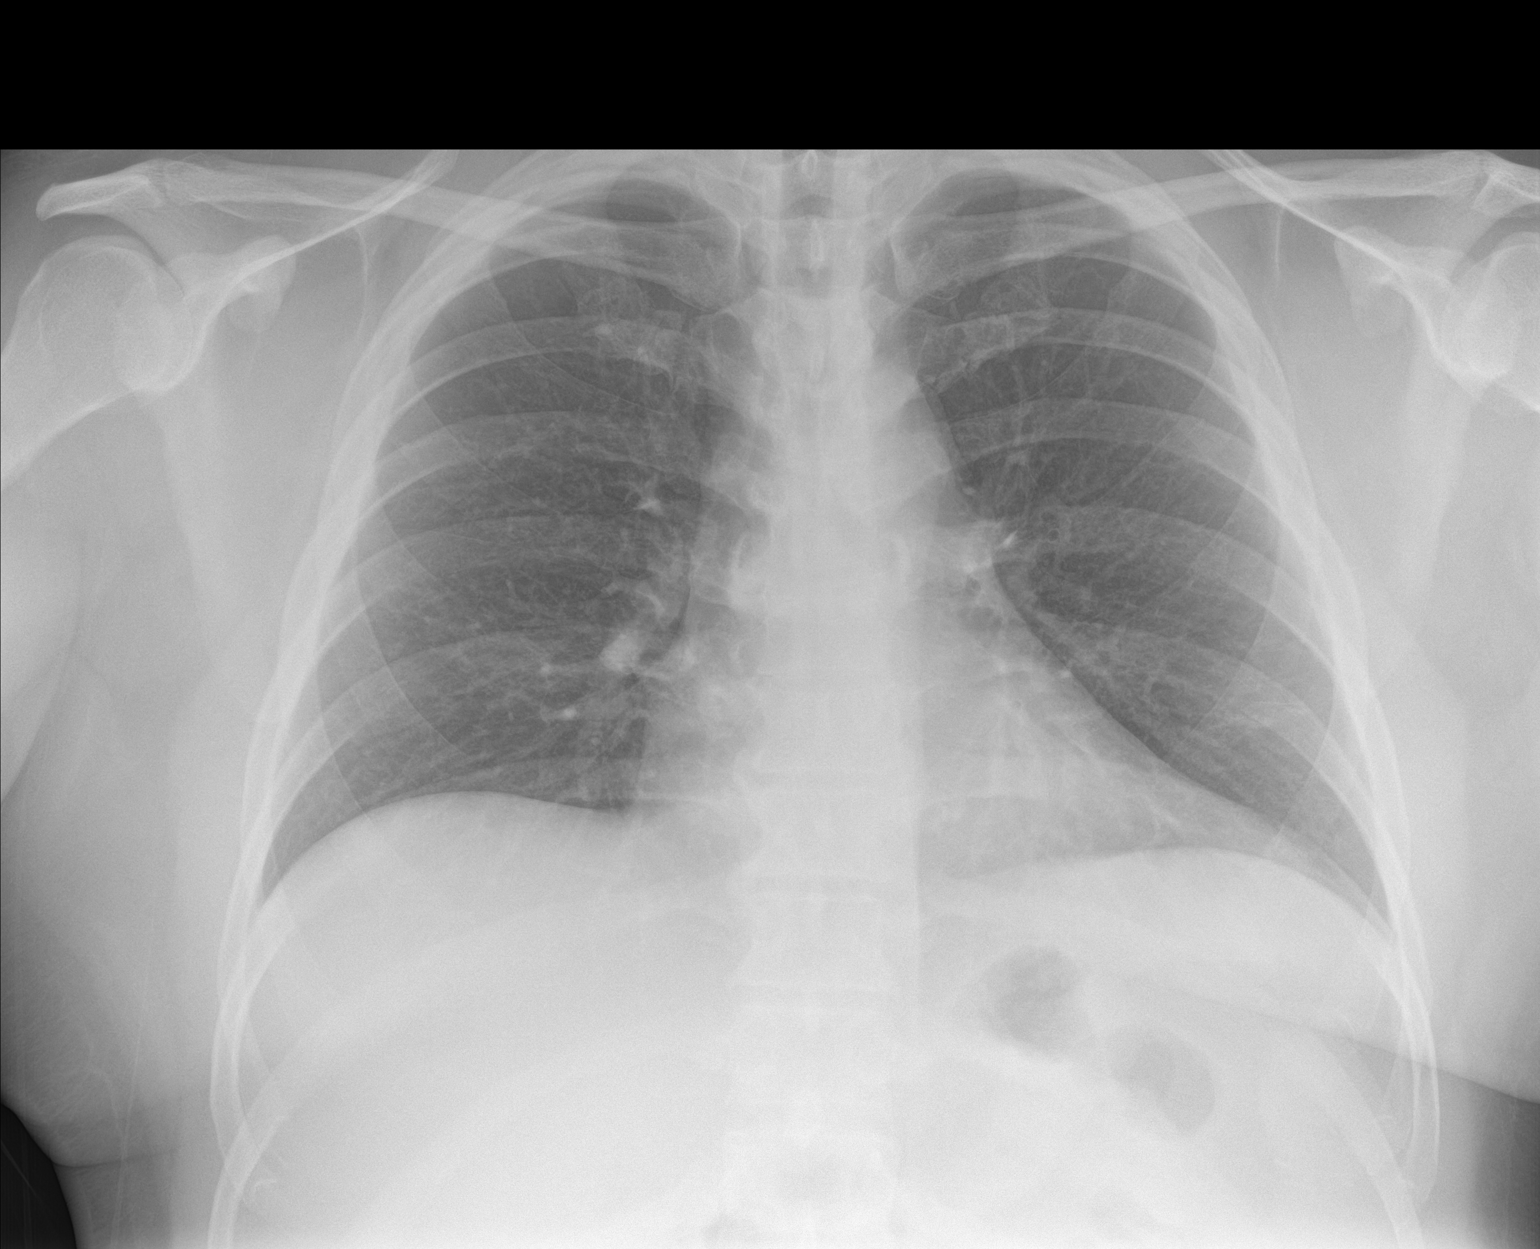

[chest lat]
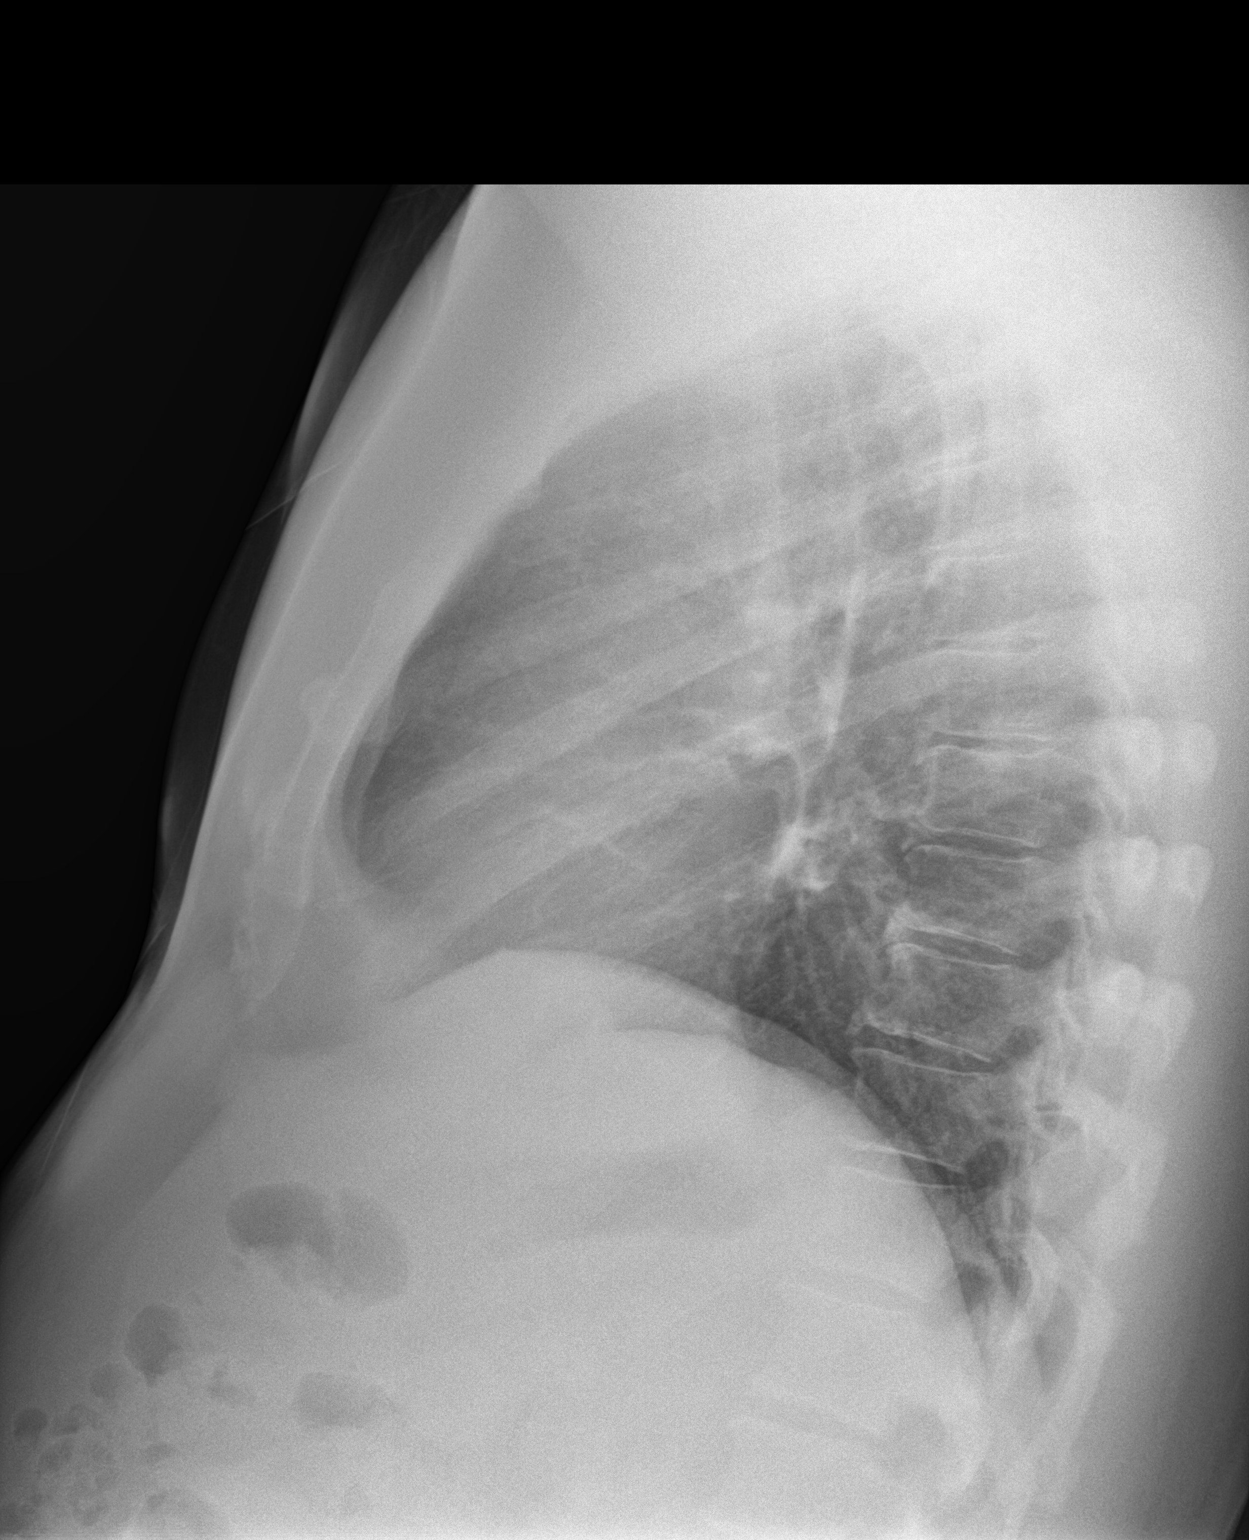

[2 of 2 positions shown; findings below may reference images not displayed]

FINDINGS: Normal heart size, mediastinal contours, and pulmonary vascularity.

Minimal central peribronchial thickening.

Minimal LEFT basilar atelectasis.

Lungs clear.

No pleural effusion or pneumothorax.
IMPRESSION: Minimal bronchitic changes and LEFT basilar atelectasis.

No definite acute infiltrate.

## 2018-04-12 ENCOUNTER — Encounter: Payer: Self-pay | Admitting: Family Medicine

## 2018-04-12 NOTE — Progress Notes (Signed)
BP 122/68 (BP Location: Left Arm, Patient Position: Sitting, Cuff Size: Large)   Pulse 85   Temp 97.8 F (36.6 C) (Oral)   Ht _0  (1.6 m)   Wt 221 lb 4 oz (100.4 kg)   SpO2 96%   BMI 39.19 kg/m    CC: 6 mo f/u visit Subjective:    Patient ID: Todd Braun, male    DOB: 1976/01/16, 43 y.o.   MRN: [id number]  HPI: Todd Braun is a 43 y.o. male presenting on 04/13/2018 for Follow-up (Here for 6 mo f/u. )   R ear feels wet. No pain. Occasional itching. Some ear drainage.   DM - does not regularly check sugars. Compliant with antihyperglycemic regimen which includes: farxiga, amaryl, actos. Has had increased sugar intake last few months. Denies hypoglycemic symptoms. Denies paresthesias. Last diabetic eye exam 07/2017. Pneumovax: DUE. Prevnar: not due. Glucometer brand: LG brand. DSME: declined. He tires to use treadmill at least once a week for 20 min. Denies fmhx medullary thyroid cancer.  Lab Results  Component Value Date   HGBA1C 8.5 (A) 04/13/2018   Diabetic Foot Exam - Simple   Simple Foot Form Diabetic Foot exam was performed with the following findings:  Yes 04/13/2018 11:26 AM  Visual Inspection No deformities, no ulcerations, no other skin breakdown bilaterally:  Yes Sensation Testing Intact to touch and monofilament testing bilaterally:  Yes Pulse Check Posterior Tibialis and Dorsalis pulse intact bilaterally:  Yes Comments    Lab Results  Component Value Date   MICROALBUR <0.7 10/05/2017       Relevant past medical, surgical, family and social history reviewed and updated as indicated. Interim medical history since our last visit reviewed. Allergies and medications reviewed and updated. Outpatient Medications Prior to Visit  Medication Sig Dispense Refill  . Blood Glucose Monitoring Suppl (BLOOD GLUCOSE METER KIT AND SUPPLIES) Use as instructed 1 each 0  . etodolac (LODINE) 500 MG tablet Take 500 mg by mouth 2 (two) times daily as needed.    Marland Kitchen FARXIGA 10 MG TABS  tablet TAKE 1 TABLET EVERY DAY 90 tablet 1  . glimepiride (AMARYL) 4 MG tablet TAKE 1 TABLET TWICE A DAY WITH MEALS 180 tablet 1  . Lancets 30G MISC 1 each by Does not apply route daily. 100 each 3  . traMADol (ULTRAM) 50 MG tablet Take 1 tablet (50 mg total) by mouth 2 (two) times daily as needed. 40 tablet 0  . atorvastatin (LIPITOR) 40 MG tablet Take 1 tablet (40 mg total) by mouth daily. 90 tablet 3  . fenofibrate (TRICOR) 145 MG tablet Take 1 tablet (145 mg total) by mouth daily. 90 tablet 3  . glucose blood test strip Check 1-2 times daily and PRN E11.40. onetouch ultra meter. 180 each 3  . pioglitazone (ACTOS) 30 MG tablet Take 1 tablet (30 mg total) by mouth daily. 90 tablet 3   No facility-administered medications prior to visit.      Per HPI unless specifically indicated in ROS section below Review of Systems Objective:    BP 122/68 (BP Location: Left Arm, Patient Position: Sitting, Cuff Size: Large)   Pulse 85   Temp 97.8 F (36.6 C) (Oral)   Ht _1  (1.6 m)   Wt 221 lb 4 oz (100.4 kg)   SpO2 96%   BMI 39.19 kg/m   Wt Readings from Last 3 Encounters:  04/13/18 221 lb 4 oz (100.4 kg)  10/05/17 220 lb (99.8 kg)  03/26/17 216  lb (98 kg)    Physical Exam Vitals signs and nursing note reviewed.  Constitutional:      General: He is not in acute distress.    Appearance: Normal appearance. He is well-developed.  HENT:     Head: Normocephalic and atraumatic.     Right Ear: Hearing and external ear normal.     Left Ear: Hearing, tympanic membrane, ear canal and external ear normal.     Ears:     Comments: Discharge R posterior canal as well as maceration of TM without perforation    Nose: Nose normal.     Mouth/Throat:     Mouth: Mucous membranes are moist.     Pharynx: Oropharynx is clear. No oropharyngeal exudate.  Eyes:     General: No scleral icterus.    Conjunctiva/sclera: Conjunctivae normal.     Pupils: Pupils are equal, round, and reactive to light.  Neck:       Musculoskeletal: Normal range of motion and neck supple.  Cardiovascular:     Rate and Rhythm: Normal rate and regular rhythm.     Heart sounds: Normal heart sounds. No murmur.  Pulmonary:     Effort: Pulmonary effort is normal. No respiratory distress.     Breath sounds: Normal breath sounds. No wheezing or rales.  Musculoskeletal:     Comments: See HPI for foot exam if done  Lymphadenopathy:     Cervical: No cervical adenopathy.  Skin:    General: Skin is warm and dry.     Findings: No rash.  Neurological:     Mental Status: He is alert.       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 Endorses weight gain on actos. Will price out ozempic. Discussed side effects, benefits. No fmhx or personal hx MTC. RTC 3 mo f/u visit.       Relevant Medications   atorvastatin (LIPITOR) 40 MG tablet   pioglitazone (ACTOS) 30 MG tablet   Semaglutide,0.25 or 0.5MG/DOS, (OZEMPIC, 0.25 OR 0.5 MG/DOSE,) 2 MG/1.5ML SOPN   Other Relevant Orders   POCT glycosylated hemoglobin (Hb A1C) (Completed)   Right otitis externa    Treat as bacterial with cortisporin (no TM perf). If no improvement, consider treating as fungal vs ENT eval.      NASH (nonalcoholic steatohepatitis)    Continue actos.        Other Visit Diagnoses    Need for influenza vaccination       Relevant Orders   Flu Vaccine QUAD 36+ mos IM       Meds ordered this encounter  Medications  . glucose blood test strip    Sig: Check 1-2 times daily and PRN E11.40. LG brand meter.    Dispense:  180 each    Refill:  3  .  atorvastatin (LIPITOR) 40 MG tablet    Sig: Take 1 tablet (40 mg total) by mouth daily.    Dispense:  90 tablet    Refill:  3  . fenofibrate (TRICOR) 145  MG tablet    Sig: Take 1 tablet (145 mg total) by mouth daily.    Dispense:  90 tablet    Refill:  3  . pioglitazone (ACTOS) 30 MG tablet    Sig: Take 1 tablet (30 mg total) by mouth daily.    Dispense:  90 tablet    Refill:  3  . Semaglutide,0.25 or 0.5MG/DOS, (OZEMPIC, 0.25 OR 0.5 MG/DOSE,) 2 MG/1.5ML SOPN    Sig: Inject 0.25 mg into the skin once a week for 30 days, THEN 0.5 mg once a week.    Dispense:  1 pen    Refill:  3  . NEOMYCIN-POLYMYXIN-HYDROCORTISONE (CORTISPORIN) 1 % SOLN OTIC solution    Sig: Place 3 drops into the right ear every 8 (eight) hours.    Dispense:  10 mL    Refill:  0   Orders Placed This Encounter  Procedures  . Flu Vaccine QUAD 36+ mos IM  . POCT glycosylated hemoglobin (Hb A1C)    Follow up plan: Return in about 3 months (around 07/13/2018) for follow up visit.  Ria Bush, MD

## 2018-04-13 ENCOUNTER — Ambulatory Visit (INDEPENDENT_AMBULATORY_CARE_PROVIDER_SITE_OTHER): Payer: BLUE CROSS/BLUE SHIELD | Admitting: Family Medicine

## 2018-04-13 ENCOUNTER — Encounter: Payer: Self-pay | Admitting: Family Medicine

## 2018-04-13 VITALS — BP 122/68 | HR 85 | Temp 97.8°F | Ht 63.0 in | Wt 221.2 lb

## 2018-04-13 DIAGNOSIS — Z23 Encounter for immunization: Secondary | ICD-10-CM | POA: Diagnosis not present

## 2018-04-13 DIAGNOSIS — IMO0002 Reserved for concepts with insufficient information to code with codable children: Secondary | ICD-10-CM

## 2018-04-13 DIAGNOSIS — E1140 Type 2 diabetes mellitus with diabetic neuropathy, unspecified: Secondary | ICD-10-CM | POA: Diagnosis not present

## 2018-04-13 DIAGNOSIS — E1165 Type 2 diabetes mellitus with hyperglycemia: Secondary | ICD-10-CM | POA: Diagnosis not present

## 2018-04-13 DIAGNOSIS — K7581 Nonalcoholic steatohepatitis (NASH): Secondary | ICD-10-CM | POA: Diagnosis not present

## 2018-04-13 DIAGNOSIS — H6091 Unspecified otitis externa, right ear: Secondary | ICD-10-CM | POA: Insufficient documentation

## 2018-04-13 DIAGNOSIS — H60501 Unspecified acute noninfective otitis externa, right ear: Secondary | ICD-10-CM

## 2018-04-13 LAB — POCT GLYCOSYLATED HEMOGLOBIN (HGB A1C): Hemoglobin A1C: 8.5 % — AB (ref 4.0–5.6)

## 2018-04-13 MED ORDER — PIOGLITAZONE HCL 30 MG PO TABS: 30.0000 mg | ORAL_TABLET | Freq: Every day | ORAL | 3 refills | Status: DC

## 2018-04-13 MED ORDER — NEOMYCIN-POLYMYXIN-HC 1 % OT SOLN: 3.0000 [drp] | Freq: Three times a day (TID) | OTIC | 0 refills | Status: DC

## 2018-04-13 MED ORDER — SEMAGLUTIDE(0.25 OR 0.5MG/DOS) 2 MG/1.5ML ~~LOC~~ SOPN: PEN_INJECTOR | SUBCUTANEOUS | 3 refills | Status: DC

## 2018-04-13 MED ORDER — GLUCOSE BLOOD VI STRP: ORAL_STRIP | 3 refills | Status: AC

## 2018-04-13 MED ORDER — ATORVASTATIN CALCIUM 40 MG PO TABS: 40.0000 mg | ORAL_TABLET | Freq: Every day | ORAL | 3 refills | Status: DC

## 2018-04-13 MED ORDER — FENOFIBRATE 145 MG PO TABS: 145.0000 mg | ORAL_TABLET | Freq: Every day | ORAL | 3 refills | Status: DC

## 2018-04-13 NOTE — Patient Instructions (Addendum)
Flu shot today. Always take glimepiride (amaryl) with a meal - try to time it with lunch.  Price out ozempic once weekly shot - coupon book provided today. First month take 0.37m weekly, then may increase to 0.570mweekly. Either review administration with pharmacy or come in to see our nurse for nurse visit to review injection technique.  Return in 3 months for follow up visit.  Use antibacterial ear drop to right ear for 1 week, let usKoreanow if not better for referral to ear doctor.

## 2018-04-13 NOTE — Assessment & Plan Note (Signed)
Continue actos.

## 2018-04-13 NOTE — Assessment & Plan Note (Signed)
Chronic, remains above goal. Endorses weight gain on actos. Will price out ozempic. Discussed side effects, benefits. No fmhx or personal hx MTC. RTC 3 mo f/u visit.

## 2018-04-13 NOTE — Assessment & Plan Note (Signed)
Treat as bacterial with cortisporin (no TM perf). If no improvement, consider treating as fungal vs ENT eval.

## 2018-05-09 ENCOUNTER — Other Ambulatory Visit: Payer: Self-pay | Admitting: Family Medicine

## 2018-07-12 ENCOUNTER — Other Ambulatory Visit: Payer: Self-pay | Admitting: Family Medicine

## 2018-07-12 DIAGNOSIS — E1140 Type 2 diabetes mellitus with diabetic neuropathy, unspecified: Secondary | ICD-10-CM

## 2018-07-12 DIAGNOSIS — E781 Pure hyperglyceridemia: Secondary | ICD-10-CM

## 2018-07-12 DIAGNOSIS — M1A9XX0 Chronic gout, unspecified, without tophus (tophi): Secondary | ICD-10-CM

## 2018-07-12 DIAGNOSIS — K7581 Nonalcoholic steatohepatitis (NASH): Secondary | ICD-10-CM

## 2018-07-12 DIAGNOSIS — E1165 Type 2 diabetes mellitus with hyperglycemia: Principal | ICD-10-CM

## 2018-07-12 DIAGNOSIS — IMO0002 Reserved for concepts with insufficient information to code with codable children: Secondary | ICD-10-CM

## 2018-07-13 ENCOUNTER — Other Ambulatory Visit (INDEPENDENT_AMBULATORY_CARE_PROVIDER_SITE_OTHER): Payer: BLUE CROSS/BLUE SHIELD

## 2018-07-13 ENCOUNTER — Other Ambulatory Visit: Payer: Self-pay

## 2018-07-13 DIAGNOSIS — E1165 Type 2 diabetes mellitus with hyperglycemia: Secondary | ICD-10-CM | POA: Diagnosis not present

## 2018-07-13 DIAGNOSIS — E1140 Type 2 diabetes mellitus with diabetic neuropathy, unspecified: Secondary | ICD-10-CM

## 2018-07-13 DIAGNOSIS — IMO0002 Reserved for concepts with insufficient information to code with codable children: Secondary | ICD-10-CM

## 2018-07-13 DIAGNOSIS — K7581 Nonalcoholic steatohepatitis (NASH): Secondary | ICD-10-CM

## 2018-07-13 DIAGNOSIS — M1A9XX0 Chronic gout, unspecified, without tophus (tophi): Secondary | ICD-10-CM | POA: Diagnosis not present

## 2018-07-13 DIAGNOSIS — E781 Pure hyperglyceridemia: Secondary | ICD-10-CM

## 2018-07-13 LAB — HEMOGLOBIN A1C: Hgb A1c MFr Bld: 7.6 % — ABNORMAL HIGH (ref 4.6–6.5)

## 2018-07-13 LAB — COMPREHENSIVE METABOLIC PANEL
ALT: 54 U/L — ABNORMAL HIGH (ref 0–53)
AST: 27 U/L (ref 0–37)
Albumin: 4.5 g/dL (ref 3.5–5.2)
Alkaline Phosphatase: 46 U/L (ref 39–117)
BUN: 19 mg/dL (ref 6–23)
CO2: 27 mEq/L (ref 19–32)
Calcium: 9.9 mg/dL (ref 8.4–10.5)
Chloride: 102 mEq/L (ref 96–112)
Creatinine, Ser: 1.12 mg/dL (ref 0.40–1.50)
GFR: 72.02 mL/min (ref >60.00)
Glucose, Bld: 144 mg/dL — ABNORMAL HIGH (ref 70–99)
Potassium: 4.2 mEq/L (ref 3.5–5.1)
Sodium: 139 mEq/L (ref 135–145)
Total Bilirubin: 0.6 mg/dL (ref 0.2–1.2)
Total Protein: 7.8 g/dL (ref 6.0–8.3)

## 2018-07-13 LAB — LIPID PANEL
Cholesterol: 158 mg/dL (ref 0–200)
HDL: 29.9 mg/dL — ABNORMAL LOW (ref >39.00)
NonHDL: 127.8
Total CHOL/HDL Ratio: 5
Triglycerides: 220 mg/dL — ABNORMAL HIGH (ref 0.0–149.0)
VLDL: 44 mg/dL — ABNORMAL HIGH (ref 0.0–40.0)

## 2018-07-13 LAB — URIC ACID: Uric Acid, Serum: 7.1 mg/dL (ref 4.0–7.8)

## 2018-07-13 LAB — LDL CHOLESTEROL, DIRECT: Direct LDL: 95 mg/dL

## 2018-07-15 ENCOUNTER — Encounter: Payer: Self-pay | Admitting: Family Medicine

## 2018-07-15 ENCOUNTER — Ambulatory Visit (INDEPENDENT_AMBULATORY_CARE_PROVIDER_SITE_OTHER): Payer: BLUE CROSS/BLUE SHIELD | Admitting: Family Medicine

## 2018-07-15 VITALS — Ht 63.0 in | Wt 218.0 lb

## 2018-07-15 DIAGNOSIS — E1165 Type 2 diabetes mellitus with hyperglycemia: Secondary | ICD-10-CM

## 2018-07-15 DIAGNOSIS — E781 Pure hyperglyceridemia: Secondary | ICD-10-CM | POA: Diagnosis not present

## 2018-07-15 DIAGNOSIS — E1140 Type 2 diabetes mellitus with diabetic neuropathy, unspecified: Secondary | ICD-10-CM

## 2018-07-15 DIAGNOSIS — K7581 Nonalcoholic steatohepatitis (NASH): Secondary | ICD-10-CM

## 2018-07-15 DIAGNOSIS — H60501 Unspecified acute noninfective otitis externa, right ear: Secondary | ICD-10-CM

## 2018-07-15 DIAGNOSIS — M1A9XX0 Chronic gout, unspecified, without tophus (tophi): Secondary | ICD-10-CM

## 2018-07-15 DIAGNOSIS — IMO0002 Reserved for concepts with insufficient information to code with codable children: Secondary | ICD-10-CM

## 2018-07-15 MED ORDER — NEOMYCIN-POLYMYXIN-HC 1 % OT SOLN: 3.0000 [drp] | Freq: Three times a day (TID) | OTIC | 0 refills | Status: DC

## 2018-07-15 MED ORDER — DAPAGLIFLOZIN PROPANEDIOL 5 MG PO TABS: 5.0000 mg | ORAL_TABLET | Freq: Every day | ORAL | 6 refills | Status: DC

## 2018-07-15 MED ORDER — SEMAGLUTIDE (1 MG/DOSE) 2 MG/1.5ML ~~LOC~~ SOPN: 1.0000 mg | PEN_INJECTOR | SUBCUTANEOUS | 1 refills | Status: DC

## 2018-07-15 NOTE — Assessment & Plan Note (Signed)
Denies recent gout flares. Not on urate lowering medication.

## 2018-07-15 NOTE — Assessment & Plan Note (Signed)
Chronic, stable on lipitor and tricor. Continue current regimen. The 10-year ASCVD risk score Todd Bussing DC Brooke Bonito., Todd al., Todd Braun) is: 2.9%   Values used to calculate the score:     Age: 43 years     Sex: Male     Is Non-Hispanic African American: No     Diabetic: Yes     Tobacco smoker: No     Systolic Blood Pressure: 820 mmHg     Is BP treated: No     HDL Cholesterol: 29.9 mg/dL     Total Cholesterol: 158 mg/dL

## 2018-07-15 NOTE — Progress Notes (Signed)
Virtual visit completed through [employer]. Due to national recommendations of social distancing due to [place] 19, a virtual visit is felt to be most appropriate for this patient at this time.   Patient location: home Provider location: [facility], office If any vitals were documented, they were collected by patient at home unless specified below.    Ht 5' 3" (1.6 m)   Wt 218 lb (98.9 kg)   BMI 38.62 kg/m    CC: 3 mo DM f/u visit Subjective:    Patient ID: Todd Braun, male    DOB: Apr 14, 1975, 43 y.o.   MRN: [id number]  HPI: Todd Braun is a 43 y.o. male presenting on 07/15/2018 for Diabetes (3 mo f/u.)   DM - does regularly check sugars 114 fasting. Compliant with antihyperglycemic regimen which includes: farxiga 31m daily, amaryl 421mbid with meals, and actos 3027maily. Last visit we started ozempic, now on 0.5mg3mekly. Tolerating well without nausea - 3 lb weight loss noted. Denies low sugars or hypoglycemic symptoms. Denies paresthesias. Last diabetic eye exam 07/2017. Pneumovax: DUE. Prevnar: not due. Glucometer brand: LG brand. DSME: declined. Lab Results  Component Value Date   HGBA1C 7.6 (H) 07/13/2018   Diabetic Foot Exam - Simple   No data filed     Lab Results  Component Value Date   MICROALBUR <0.7 10/05/2017     R external otitis did significantly improve with cortisporin otic drops but feels itchiness recurring, requests ear drop refill.  NASH - on actos.      Relevant past medical, surgical, family and social history reviewed and updated as indicated. Interim medical history since our last visit reviewed. Allergies and medications reviewed and updated. Outpatient Medications Prior to Visit  Medication Sig Dispense Refill  . atorvastatin (LIPITOR) 40 MG tablet Take 1 tablet (40 mg total) by mouth daily. 90 tablet 3  . Blood Glucose Monitoring Suppl (BLOOD GLUCOSE METER KIT AND SUPPLIES) Use as instructed 1 each 0  . etodolac (LODINE) 500 MG tablet Take  500 mg by mouth 2 (two) times daily as needed.    . fenofibrate (TRICOR) 145 MG tablet Take 1 tablet (145 mg total) by mouth daily. 90 tablet 3  . glimepiride (AMARYL) 4 MG tablet TAKE 1 TABLET TWICE A DAY WITH MEALS 180 tablet 4  . glucose blood test strip Check 1-2 times daily and PRN E11.40. LG brand meter. 180 each 3  . Lancets 30G MISC 1 each by Does not apply route daily. 100 each 3  . pioglitazone (ACTOS) 30 MG tablet Take 1 tablet (30 mg total) by mouth daily. 90 tablet 3  . traMADol (ULTRAM) 50 MG tablet Take 1 tablet (50 mg total) by mouth 2 (two) times daily as needed. 40 tablet 0  . FARXIGA 10 MG TABS tablet TAKE 1 TABLET EVERY DAY 90 tablet 1  . NEOMYCIN-POLYMYXIN-HYDROCORTISONE (CORTISPORIN) 1 % SOLN OTIC solution Place 3 drops into the right ear every 8 (eight) hours. 10 mL 0  . Semaglutide,0.25 or 0.5MG/DOS, (OZEMPIC, 0.25 OR 0.5 MG/DOSE,) 2 MG/1.5ML SOPN Inject 0.25 mg into the skin once a week for 30 days, THEN 0.5 mg once a week. 1 pen 3   No facility-administered medications prior to visit.      Per HPI unless specifically indicated in ROS section below Review of Systems Objective:    Ht 5' 3" (1.6 m)   Wt 218 lb (98.9 kg)   BMI 38.62 kg/m   Wt Readings from  Last 3 Encounters:  07/15/18 218 lb (98.9 kg)  04/13/18 221 lb 4 oz (100.4 kg)  10/05/17 220 lb (99.8 kg)     Physical exam: Gen: alert, NAD, not ill appearing Pulm: speaks in complete sentences without increased work of breathing Psych: normal mood, normal thought content      Results for orders placed or performed in visit on 07/13/18  Uric acid  Result Value Ref Range   Uric Acid, Serum 7.1 4.0 - 7.8 mg/dL  Hemoglobin A1c  Result Value Ref Range   Hgb A1c MFr Bld 7.6 (H) 4.6 - 6.5 %  Comprehensive metabolic panel  Result Value Ref Range   Sodium 139 135 - 145 mEq/L   Potassium 4.2 3.5 - 5.1 mEq/L   Chloride 102 96 - 112 mEq/L   CO2 27 19 - 32 mEq/L   Glucose, Bld 144 (H) 70 - 99 mg/dL   BUN  19 6 - 23 mg/dL   Creatinine, Ser 1.12 0.40 - 1.50 mg/dL   Total Bilirubin 0.6 0.2 - 1.2 mg/dL   Alkaline Phosphatase 46 39 - 117 U/L   AST 27 0 - 37 U/L   ALT 54 (H) 0 - 53 U/L   Total Protein 7.8 6.0 - 8.3 g/dL   Albumin 4.5 3.5 - 5.2 g/dL   Calcium 9.9 8.4 - 10.5 mg/dL   GFR 72.02 >60.00 mL/min  Lipid panel  Result Value Ref Range   Cholesterol 158 0 - 200 mg/dL   Triglycerides 220.0 (H) 0.0 - 149.0 mg/dL   HDL 29.90 (L) >39.00 mg/dL   VLDL 44.0 (H) 0.0 - 40.0 mg/dL   Total CHOL/HDL Ratio 5    NonHDL 127.80   LDL cholesterol, direct  Result Value Ref Range   Direct LDL 95.0 mg/dL   Assessment & Plan:  I asked patient to call us to schedule 3-4 mo CPE Problem List Items Addressed This Visit    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 Congratulated. He is limiting sugar in diet. Desires to come off farxiga concerned it causes excess thirst - will decrease to 81m dose. Will increase ozempic to 157mweekly. Continue amaryl, actos.       Relevant Medications   Semaglutide, 1 MG/DOSE, (OZEMPIC, 1 MG/DOSE,) 2 MG/1.5ML SOPN   dapagliflozin propanediol (FARXIGA) 5 MG TABS tablet   Severe obesity (BMI 35.0-39.9) with comorbidity ([place]  Relevant Medications   Semaglutide, 1 MG/DOSE, (OZEMPIC, 1 MG/DOSE,) 2 MG/1.5ML SOPN   dapagliflozin propanediol (FARXIGA) 5 MG TABS tablet   Right otitis externa    Cortisporin was effective 03/2018 - will refill x1. If recurs, low threshold to refer to ENT to eval for fungal infection.       NASH (nonalcoholic steatohepatitis)    Stable period on actos.       Hypertriglyceridemia    Chronic, stable on lipitor and tricor. Continue current regimen. The 10-year ASCVD risk score (GMikey BussingC JrBrooke Bonito et al., 2013) is: 2.9%   Values used to calculate the score:     Age: [age]     Sex: Male     Is Non-Hispanic African American: No     Diabetic: Yes     Tobacco smoker: No     Systolic Blood Pressure: 12782mmHg     Is BP treated: No     HDL Cholesterol: 29.9 mg/dL     Total Cholesterol: 158 mg/dL  Gout    Denies recent gout flares. Not on urate lowering medication.           Meds ordered this encounter  Medications  . Semaglutide, 1 MG/DOSE, (OZEMPIC, 1 MG/DOSE,) 2 MG/1.5ML SOPN    Sig: Inject 1 mg into the skin once a week.    Dispense:  6 pen    Refill:  1    Note new dose  . dapagliflozin propanediol (FARXIGA) 5 MG TABS tablet    Sig: Take 5 mg by mouth daily.    Dispense:  30 tablet    Refill:  6    Note new sig  . NEOMYCIN-POLYMYXIN-HYDROCORTISONE (CORTISPORIN) 1 % SOLN OTIC solution    Sig: Place 3 drops into the right ear every 8 (eight) hours.    Dispense:  10 mL    Refill:  0   No orders of the defined types were placed in this encounter.   Follow up plan: Return in about 3 months (around 10/14/2018) for annual exam, prior fasting for blood work.  Ria Bush, MD

## 2018-07-15 NOTE — Patient Instructions (Signed)
Return in 3-4 months for physical.

## 2018-07-15 NOTE — Assessment & Plan Note (Addendum)
Cortisporin was effective 03/2018 - will refill x1. If recurs, low threshold to refer to ENT to eval for fungal infection.

## 2018-07-15 NOTE — Assessment & Plan Note (Signed)
Chronic, improving. Congratulated. He is limiting sugar in diet. Desires to come off farxiga concerned it causes excess thirst - will decrease to 35m dose. Will increase ozempic to 119mweekly. Continue amaryl, actos.

## 2018-07-15 NOTE — Assessment & Plan Note (Signed)
Stable period on actos.

## 2018-08-02 LAB — HM DIABETES EYE EXAM

## 2018-08-04 ENCOUNTER — Encounter: Payer: Self-pay | Admitting: Family Medicine

## 2018-12-26 ENCOUNTER — Other Ambulatory Visit: Payer: Self-pay | Admitting: Family Medicine

## 2018-12-28 NOTE — Telephone Encounter (Signed)
E-scribed refill.  Pls schedule CPE and labs.

## 2018-12-28 NOTE — Telephone Encounter (Signed)
Pt scheduled cpe and same day labs 02/22/19

## 2019-01-12 ENCOUNTER — Encounter: Payer: Self-pay | Admitting: Family Medicine

## 2019-01-12 NOTE — Telephone Encounter (Signed)
Scheduled pt for labs on 02/15/19

## 2019-02-14 ENCOUNTER — Other Ambulatory Visit: Payer: Self-pay | Admitting: Family Medicine

## 2019-02-14 DIAGNOSIS — K7581 Nonalcoholic steatohepatitis (NASH): Secondary | ICD-10-CM

## 2019-02-14 DIAGNOSIS — E1140 Type 2 diabetes mellitus with diabetic neuropathy, unspecified: Secondary | ICD-10-CM

## 2019-02-14 DIAGNOSIS — IMO0002 Reserved for concepts with insufficient information to code with codable children: Secondary | ICD-10-CM

## 2019-02-14 DIAGNOSIS — E781 Pure hyperglyceridemia: Secondary | ICD-10-CM

## 2019-02-14 DIAGNOSIS — M1A9XX0 Chronic gout, unspecified, without tophus (tophi): Secondary | ICD-10-CM

## 2019-02-15 ENCOUNTER — Other Ambulatory Visit (INDEPENDENT_AMBULATORY_CARE_PROVIDER_SITE_OTHER): Payer: BC Managed Care – PPO

## 2019-02-15 DIAGNOSIS — E1165 Type 2 diabetes mellitus with hyperglycemia: Secondary | ICD-10-CM | POA: Diagnosis not present

## 2019-02-15 DIAGNOSIS — E1140 Type 2 diabetes mellitus with diabetic neuropathy, unspecified: Secondary | ICD-10-CM

## 2019-02-15 DIAGNOSIS — IMO0002 Reserved for concepts with insufficient information to code with codable children: Secondary | ICD-10-CM

## 2019-02-15 DIAGNOSIS — K7581 Nonalcoholic steatohepatitis (NASH): Secondary | ICD-10-CM

## 2019-02-15 DIAGNOSIS — E781 Pure hyperglyceridemia: Secondary | ICD-10-CM | POA: Diagnosis not present

## 2019-02-15 DIAGNOSIS — M1A9XX0 Chronic gout, unspecified, without tophus (tophi): Secondary | ICD-10-CM

## 2019-02-15 LAB — POC URINALSYSI DIPSTICK (AUTOMATED)
Bilirubin, UA: NEGATIVE
Blood, UA: NEGATIVE
Glucose, UA: POSITIVE — AB
Ketones, UA: NEGATIVE
Leukocytes, UA: NEGATIVE
Nitrite, UA: NEGATIVE
Protein, UA: NEGATIVE
Spec Grav, UA: 1.015 (ref 1.010–1.025)
Urobilinogen, UA: 0.2 E.U./dL
pH, UA: 6.5 (ref 5.0–8.0)

## 2019-02-15 LAB — HEMOGLOBIN A1C: Hgb A1c MFr Bld: 7 % — ABNORMAL HIGH (ref 4.6–6.5)

## 2019-02-15 LAB — LIPID PANEL
Cholesterol: 115 mg/dL (ref 0–200)
HDL: 34.9 mg/dL — ABNORMAL LOW (ref >39.00)
LDL Cholesterol: 61 mg/dL (ref 0–99)
NonHDL: 80.21
Total CHOL/HDL Ratio: 3
Triglycerides: 98 mg/dL (ref 0.0–149.0)
VLDL: 19.6 mg/dL (ref 0.0–40.0)

## 2019-02-15 LAB — COMPREHENSIVE METABOLIC PANEL
ALT: 45 U/L (ref 0–53)
AST: 32 U/L (ref 0–37)
Albumin: 4.1 g/dL (ref 3.5–5.2)
Alkaline Phosphatase: 43 U/L (ref 39–117)
BUN: 15 mg/dL (ref 6–23)
CO2: 27 mEq/L (ref 19–32)
Calcium: 9.4 mg/dL (ref 8.4–10.5)
Chloride: 104 mEq/L (ref 96–112)
Creatinine, Ser: 1.04 mg/dL (ref 0.40–1.50)
GFR: 78.23 mL/min (ref >60.00)
Glucose, Bld: 107 mg/dL — ABNORMAL HIGH (ref 70–99)
Potassium: 4 mEq/L (ref 3.5–5.1)
Sodium: 137 mEq/L (ref 135–145)
Total Bilirubin: 0.6 mg/dL (ref 0.2–1.2)
Total Protein: 7.5 g/dL (ref 6.0–8.3)

## 2019-02-15 LAB — MICROALBUMIN / CREATININE URINE RATIO
Creatinine,U: 75 mg/dL
Microalb Creat Ratio: 0.9 mg/g (ref 0.0–30.0)
Microalb, Ur: <0.7 mg/dL (ref 0.0–1.9)

## 2019-02-15 LAB — URIC ACID: Uric Acid, Serum: 6.3 mg/dL (ref 4.0–7.8)

## 2019-02-15 NOTE — Addendum Note (Signed)
Addended by: Lurlean Nanny on: 02/15/2019 02:20 PM   Modules accepted: Orders

## 2019-02-22 ENCOUNTER — Encounter: Payer: Self-pay | Admitting: Family Medicine

## 2019-02-22 ENCOUNTER — Ambulatory Visit (INDEPENDENT_AMBULATORY_CARE_PROVIDER_SITE_OTHER): Payer: BC Managed Care – PPO | Admitting: Family Medicine

## 2019-02-22 ENCOUNTER — Other Ambulatory Visit: Payer: Self-pay

## 2019-02-22 VITALS — BP 118/82 | HR 94 | Temp 98.3°F | Ht 63.0 in | Wt 224.6 lb

## 2019-02-22 DIAGNOSIS — K7581 Nonalcoholic steatohepatitis (NASH): Secondary | ICD-10-CM

## 2019-02-22 DIAGNOSIS — E1169 Type 2 diabetes mellitus with other specified complication: Secondary | ICD-10-CM

## 2019-02-22 DIAGNOSIS — E1140 Type 2 diabetes mellitus with diabetic neuropathy, unspecified: Secondary | ICD-10-CM

## 2019-02-22 DIAGNOSIS — K921 Melena: Secondary | ICD-10-CM

## 2019-02-22 DIAGNOSIS — K824 Cholesterolosis of gallbladder: Secondary | ICD-10-CM | POA: Diagnosis not present

## 2019-02-22 DIAGNOSIS — Z0000 Encounter for general adult medical examination without abnormal findings: Secondary | ICD-10-CM

## 2019-02-22 DIAGNOSIS — E1165 Type 2 diabetes mellitus with hyperglycemia: Secondary | ICD-10-CM

## 2019-02-22 DIAGNOSIS — IMO0002 Reserved for concepts with insufficient information to code with codable children: Secondary | ICD-10-CM

## 2019-02-22 DIAGNOSIS — E782 Mixed hyperlipidemia: Secondary | ICD-10-CM

## 2019-02-22 DIAGNOSIS — M1A9XX0 Chronic gout, unspecified, without tophus (tophi): Secondary | ICD-10-CM | POA: Diagnosis not present

## 2019-02-22 MED ORDER — FENOFIBRATE 145 MG PO TABS: 145.0000 mg | ORAL_TABLET | Freq: Every day | ORAL | 3 refills | Status: DC

## 2019-02-22 MED ORDER — ATORVASTATIN CALCIUM 40 MG PO TABS: 40.0000 mg | ORAL_TABLET | Freq: Every day | ORAL | 3 refills | Status: DC

## 2019-02-22 MED ORDER — OZEMPIC (1 MG/DOSE) 2 MG/1.5ML ~~LOC~~ SOPN: 1.0000 mg | PEN_INJECTOR | SUBCUTANEOUS | 3 refills | Status: DC

## 2019-02-22 MED ORDER — PIOGLITAZONE HCL 30 MG PO TABS: 30.0000 mg | ORAL_TABLET | Freq: Every day | ORAL | 3 refills | Status: DC

## 2019-02-22 MED ORDER — GLIMEPIRIDE 4 MG PO TABS: 4.0000 mg | ORAL_TABLET | Freq: Every day | ORAL | 3 refills | Status: DC

## 2019-02-22 MED ORDER — DAPAGLIFLOZIN PROPANEDIOL 5 MG PO TABS: 5.0000 mg | ORAL_TABLET | Freq: Every day | ORAL | 3 refills | Status: DC

## 2019-02-22 NOTE — Assessment & Plan Note (Signed)
Urate level stable without recent gout flares.

## 2019-02-22 NOTE — Progress Notes (Signed)
This visit was conducted in person.  BP 118/82 (BP Location: Left Arm, Patient Position: Sitting, Cuff Size: Large)   Pulse 94   Temp 98.3 F (36.8 C) (Temporal)   Ht _0  (1.6 m)   Wt 224 lb 9 oz (101.9 kg)   SpO2 95%   BMI 39.78 kg/m    CC: CPE Subjective:    Patient ID: Todd Braun, male    DOB: [birth year], 43 y.o.   MRN: [id number]  HPI: Todd Braun is a 43 y.o. male presenting on 02/22/2019 for Annual Exam   "I think I have an ulcer when I eat hot peppers" - bloody stools (more than just when wiping) when he eats hot thai pepper sauce - no blood noted in stool since he stopped this (about 3 months ago). No abd pain, nausea/vomiting, diarrhea. Some constipation. Possible hemorrhoid. Denies fmhx colon cancer. Denies significant GERD.   DM - hasn't checked sugars at home recently.   Preventative:  Flu shot declines Pneumonia shot - declines Tdap 2013 Seat belt use discussed Sunscreen use discussed. No changing moles on skin. Non smoker  Alcohol - 1 beer/day Dentist - Q6 mo Eye exam - yearly  Caffeine: 3-4 cups/night coffee  Lives with wife, son (2005) and twins (2012), 1 outside cat  Occupation: toolmaker  Edu: HS  Activity:no regular exercise Diet: good water, fruits/vegetables occasionally     Relevant past medical, surgical, family and social history reviewed and updated as indicated. Interim medical history since our last visit reviewed. Allergies and medications reviewed and updated. Outpatient Medications Prior to Visit  Medication Sig Dispense Refill  . Blood Glucose Monitoring Suppl (BLOOD GLUCOSE METER KIT AND SUPPLIES) Use as instructed 1 each 0  . etodolac (LODINE) 500 MG tablet Take 500 mg by mouth 2 (two) times daily as needed.    Marland Kitchen glucose blood test strip Check 1-2 times daily and PRN E11.40. LG brand meter. 180 each 3  . Lancets 30G MISC 1 each by Does not apply route daily. 100 each 3  . NEOMYCIN-POLYMYXIN-HYDROCORTISONE (CORTISPORIN) 1 % SOLN  OTIC solution Place 3 drops into the right ear every 8 (eight) hours. 10 mL 0  . traMADol (ULTRAM) 50 MG tablet Take 1 tablet (50 mg total) by mouth 2 (two) times daily as needed. 40 tablet 0  . atorvastatin (LIPITOR) 40 MG tablet Take 1 tablet (40 mg total) by mouth daily. 90 tablet 3  . dapagliflozin propanediol (FARXIGA) 5 MG TABS tablet Take 5 mg by mouth daily. 30 tablet 6  . fenofibrate (TRICOR) 145 MG tablet Take 1 tablet (145 mg total) by mouth daily. 90 tablet 3  . glimepiride (AMARYL) 4 MG tablet TAKE 1 TABLET TWICE A DAY WITH MEALS 180 tablet 4  . OZEMPIC, 1 MG/DOSE, 2 MG/1.5ML SOPN INJECT 1 MG INTO THE SKIN ONCE A WEEK. 6 pen 1  . pioglitazone (ACTOS) 30 MG tablet Take 1 tablet (30 mg total) by mouth daily. 90 tablet 3   No facility-administered medications prior to visit.      Per HPI unless specifically indicated in ROS section below Review of Systems  Constitutional: Negative for activity change, appetite change, chills, fatigue, fever and unexpected weight change.  HENT: Negative for hearing loss.   Eyes: Negative for visual disturbance.  Respiratory: Positive for shortness of breath (with exertion ie running - attributes to out of shape). Negative for cough, chest tightness and wheezing.   Cardiovascular: Negative for chest pain, palpitations and leg swelling.  Gastrointestinal: Positive for blood in stool and constipation. Negative for abdominal distention, abdominal pain, diarrhea, nausea and vomiting.  Genitourinary: Negative for difficulty urinating and hematuria.  Musculoskeletal: Negative for arthralgias, myalgias and neck pain.  Skin: Negative for rash.  Neurological: Negative for dizziness, seizures, syncope and headaches.  Hematological: Negative for adenopathy. Does not bruise/bleed easily.  Psychiatric/Behavioral: Negative for dysphoric mood. The patient is not nervous/anxious.    Objective:    BP 118/82 (BP Location: Left Arm, Patient Position: Sitting, Cuff  Size: Large)   Pulse 94   Temp 98.3 F (36.8 C) (Temporal)   Ht _0  (1.6 m)   Wt 224 lb 9 oz (101.9 kg)   SpO2 95%   BMI 39.78 kg/m   Wt Readings from Last 3 Encounters:  02/22/19 224 lb 9 oz (101.9 kg)  07/15/18 218 lb (98.9 kg)  04/13/18 221 lb 4 oz (100.4 kg)    Physical Exam Vitals signs and nursing note reviewed.  Constitutional:      General: He is not in acute distress.    Appearance: Normal appearance. He is well-developed. He is obese. He is not ill-appearing.  HENT:     Head: Normocephalic and atraumatic.     Right Ear: Hearing, tympanic membrane, ear canal and external ear normal.     Left Ear: Hearing, tympanic membrane, ear canal and external ear normal.     Nose: Nose normal.     Mouth/Throat:     Mouth: Mucous membranes are moist.     Pharynx: Oropharynx is clear. Uvula midline. No posterior oropharyngeal erythema.  Eyes:     General: No scleral icterus.    Extraocular Movements: Extraocular movements intact.     Conjunctiva/sclera: Conjunctivae normal.     Pupils: Pupils are equal, round, and reactive to light.  Neck:     Musculoskeletal: Normal range of motion and neck supple.  Cardiovascular:     Rate and Rhythm: Normal rate and regular rhythm.     Pulses: Normal pulses.          Radial pulses are 2+ on the right side and 2+ on the left side.     Heart sounds: Normal heart sounds. No murmur.  Pulmonary:     Effort: Pulmonary effort is normal. No respiratory distress.     Breath sounds: Normal breath sounds. No wheezing, rhonchi or rales.  Abdominal:     General: Abdomen is flat. Bowel sounds are normal. There is no distension.     Palpations: Abdomen is soft. There is no mass.     Tenderness: There is no abdominal tenderness. There is no guarding or rebound.     Hernia: No hernia is present.  Genitourinary:    Rectum: External hemorrhoid (noninflamed R sided) present. No tenderness or anal fissure.  Musculoskeletal: Normal range of motion.      Right lower leg: No edema.     Left lower leg: No edema.  Lymphadenopathy:     Cervical: No cervical adenopathy.  Skin:    General: Skin is warm and dry.     Findings: No rash.  Neurological:     General: No focal deficit present.     Mental Status: He is alert and oriented to person, place, and time.     Comments: CN grossly intact, station and gait intact  Psychiatric:        Mood and Affect: Mood normal.        Behavior: Behavior normal.  Thought Content: Thought content normal.        Judgment: Judgment normal.    Diabetic Foot Exam - Simple   Simple Foot Form Diabetic Foot exam was performed with the following findings: Yes 02/22/2019 10:12 AM  Visual Inspection No deformities, no ulcerations, no other skin breakdown bilaterally: Yes Sensation Testing Intact to touch and monofilament testing bilaterally: Yes Pulse Check Posterior Tibialis and Dorsalis pulse intact bilaterally: Yes Comments       Results for orders placed or performed in visit on 02/15/19  Microalbumin / creatinine urine ratio  Result Value Ref Range   Microalb, Ur <0.7 0.0 - 1.9 mg/dL   Creatinine,U 75.0 mg/dL   Microalb Creat Ratio 0.9 0.0 - 30.0 mg/g  Uric acid  Result Value Ref Range   Uric Acid, Serum 6.3 4.0 - 7.8 mg/dL  Hemoglobin A1c  Result Value Ref Range   Hgb A1c MFr Bld 7.0 (H) 4.6 - 6.5 %  Comprehensive metabolic panel  Result Value Ref Range   Sodium 137 135 - 145 mEq/L   Potassium 4.0 3.5 - 5.1 mEq/L   Chloride 104 96 - 112 mEq/L   CO2 27 19 - 32 mEq/L   Glucose, Bld 107 (H) 70 - 99 mg/dL   BUN 15 6 - 23 mg/dL   Creatinine, Ser 1.04 0.40 - 1.50 mg/dL   Total Bilirubin 0.6 0.2 - 1.2 mg/dL   Alkaline Phosphatase 43 39 - 117 U/L   AST 32 0 - 37 U/L   ALT 45 0 - 53 U/L   Total Protein 7.5 6.0 - 8.3 g/dL   Albumin 4.1 3.5 - 5.2 g/dL   GFR 78.23 >60.00 mL/min   Calcium 9.4 8.4 - 10.5 mg/dL  Lipid panel  Result Value Ref Range   Cholesterol 115 0 - 200 mg/dL    Triglycerides 98.0 0.0 - 149.0 mg/dL   HDL 34.90 (L) >39.00 mg/dL   VLDL 19.6 0.0 - 40.0 mg/dL   LDL Cholesterol 61 0 - 99 mg/dL   Total CHOL/HDL Ratio 3    NonHDL 80.21   POCT Urinalysis Dipstick (Automated)  Result Value Ref Range   Color, UA yellow    Clarity, UA clear    Glucose, UA Positive (A) Negative   Bilirubin, UA neg    Ketones, UA neg    Spec Grav, UA 1.015 1.010 - 1.025   Blood, UA neg    pH, UA 6.5 5.0 - 8.0   Protein, UA Negative Negative   Urobilinogen, UA 0.2 0.2 or 1.0 E.U./dL   Nitrite, UA neg    Leukocytes, UA Negative Negative   Assessment & Plan:  This visit occurred during the SARS-CoV-2 public health emergency.  Safety protocols were in place, including screening questions prior to the visit, additional usage of staff PPE, and extensive cleaning of exam room while observing appropriate contact time as indicated for disinfecting solutions.   Problem List Items Addressed This Visit    Uncontrolled type 2 diabetes with neuropathy ([place])    Chronic, significant improvement on current regimen. Encouraged continue 4 drug antihyperglycemic regimen. Congratulated on good control to date. Foot exam today. States UTD eye exam. Declines pneumovax.       Relevant Medications   atorvastatin (LIPITOR) 40 MG tablet   dapagliflozin propanediol (FARXIGA) 5 MG TABS tablet   glimepiride (AMARYL) 4 MG tablet   Semaglutide, 1 MG/DOSE, (OZEMPIC, 1 MG/DOSE,) 2 MG/1.5ML SOPN   pioglitazone (ACTOS) 30 MG tablet   Severe obesity (BMI  35.0-39.9) with comorbidity ([place])    Encouraged healthy diet and lifestyle changes to affect sustainable weight loss.       Relevant Medications   dapagliflozin propanediol (FARXIGA) 5 MG TABS tablet   glimepiride (AMARYL) 4 MG tablet   Semaglutide, 1 MG/DOSE, (OZEMPIC, 1 MG/DOSE,) 2 MG/1.5ML SOPN   pioglitazone (ACTOS) 30 MG tablet   NASH (nonalcoholic steatohepatitis)    LFTs stable on actos.       Health maintenance examination - Primary     Preventative protocols reviewed and updated unless pt declined. Discussed healthy diet and lifestyle.       Gout    Urate level stable without recent gout flares.       Gallbladder polyp    LFTs normal. Consider updated liver US in future. Latest stable 2018.       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 The ASCVD Risk score [name], et al., 2013) failed to calculate for the following reasons:   The valid total cholesterol range is 130 to 320 mg/dL       Relevant Medications   atorvastatin (LIPITOR) 40 MG tablet   dapagliflozin propanediol (FARXIGA) 5 MG TABS tablet   fenofibrate (TRICOR) 145 MG tablet   glimepiride (AMARYL) 4 MG tablet   Semaglutide, 1 MG/DOSE, (OZEMPIC, 1 MG/DOSE,) 2 MG/1.5ML SOPN   pioglitazone (ACTOS) 30 MG tablet   Blood in stool    Endorsed after eating very spicy foods, better since he has abstained from this. Exam with non inflamed hemorrhoid noted on right. Check iFOB today. Discussed low threshold to refer to GI if recurrent bleed.       Relevant Orders   Fecal occult blood, imunochemical       Meds ordered this encounter  Medications  . atorvastatin (LIPITOR) 40 MG tablet    Sig: Take 1 tablet (40 mg total) by mouth daily.    Dispense:  90 tablet    Refill:  3  . dapagliflozin propanediol (FARXIGA) 5 MG TABS tablet    Sig: Take 5 mg by mouth daily.    Dispense:  90 tablet    Refill:  3    Note new sig  . fenofibrate (TRICOR) 145 MG tablet    Sig: Take 1 tablet (145 mg total) by mouth daily.    Dispense:  90 tablet    Refill:  3  . glimepiride (AMARYL) 4 MG tablet    Sig: Take 1 tablet (4 mg total) by mouth daily.    Dispense:  90 tablet    Refill:  3  . Semaglutide, 1 MG/DOSE, (OZEMPIC, 1 MG/DOSE,) 2 MG/1.5ML SOPN    Sig: Inject 1 mg as directed once a week.    Dispense:   6 pen    Refill:  3  . pioglitazone (ACTOS) 30 MG tablet    Sig: Take 1 tablet (30 mg total) by mouth daily.    Dispense:  90 tablet    Refill:  3   Orders Placed This Encounter  Procedures  . Fecal occult blood, imunochemical    Standing Status:   Future    Standing Expiration Date:   02/22/2020    Patient instructions: Pass by lab to pick up stool kit. Let me know if any further blood in stool to refer you to GI to discuss colonoscopy.  Congratulations on great  sugar and cholesterol control! Continue current medicines.  You are doing well today. Return as needed or in 6 months for diabetes follow up visit.   Follow up plan: Return in about 6 months (around 08/23/2019).  Ria Bush, MD

## 2019-02-22 NOTE — Assessment & Plan Note (Signed)
Encouraged healthy diet and lifestyle changes to affect sustainable weight loss.  

## 2019-02-22 NOTE — Assessment & Plan Note (Signed)
LFTs stable on actos.

## 2019-02-22 NOTE — Assessment & Plan Note (Signed)
Endorsed after eating very spicy foods, better since he has abstained from this. Exam with non inflamed hemorrhoid noted on right. Check iFOB today. Discussed low threshold to refer to GI if recurrent bleed.

## 2019-02-22 NOTE — Assessment & Plan Note (Addendum)
Chronic, significant improvement on current regimen. Encouraged continue 4 drug antihyperglycemic regimen. Congratulated on good control to date. Foot exam today. States UTD eye exam. Declines pneumovax.

## 2019-02-22 NOTE — Assessment & Plan Note (Signed)
Preventative protocols reviewed and updated unless pt declined. Discussed healthy diet and lifestyle.  

## 2019-02-22 NOTE — Assessment & Plan Note (Signed)
Chronic, stable on lipitor and tricor - continue current regimen. The ASCVD Risk score [name], et al., 2013) failed to calculate for the following reasons:   The valid total cholesterol range is 130 to 320 mg/dL

## 2019-02-22 NOTE — Assessment & Plan Note (Signed)
LFTs normal. Consider updated liver US in future. Latest stable 2018.

## 2019-02-22 NOTE — Patient Instructions (Addendum)
Pass by lab to pick up stool kit. Let me know if any further blood in stool to refer you to GI to discuss colonoscopy.  Congratulations on great sugar and cholesterol control! Continue current medicines.  You are doing well today. Return as needed or in 6 months for diabetes follow up visit.   Health Maintenance, Male Adopting a healthy lifestyle and getting preventive care are important in promoting health and wellness. Ask your health care provider about:  The right schedule for you to have regular tests and exams.  Things you can do on your own to prevent diseases and keep yourself healthy. What should I know about diet, weight, and exercise? Eat a healthy diet   Eat a diet that includes plenty of vegetables, fruits, low-fat dairy products, and lean protein.  Do not eat a lot of foods that are high in solid fats, added sugars, or sodium. Maintain a healthy weight Body mass index (BMI) is a measurement that can be used to identify possible weight problems. It estimates body fat based on height and weight. Your health care provider can help determine your BMI and help you achieve or maintain a healthy weight. Get regular exercise Get regular exercise. This is one of the most important things you can do for your health. Most adults should:  Exercise for at least 150 minutes each week. The exercise should increase your heart rate and make you sweat (moderate-intensity exercise).  Do strengthening exercises at least twice a week. This is in addition to the moderate-intensity exercise.  Spend less time sitting. Even light physical activity can be beneficial. Watch cholesterol and blood lipids Have your blood tested for lipids and cholesterol at 42 years of age, then have this test every 5 years. You may need to have your cholesterol levels checked more often if:  Your lipid or cholesterol levels are high.  You are older than 42 years of age.  You are at high risk for heart  disease. What should I know about cancer screening? Many types of cancers can be detected early and may often be prevented. Depending on your health history and family history, you may need to have cancer screening at various ages. This may include screening for:  Colorectal cancer.  Prostate cancer.  Skin cancer.  Lung cancer. What should I know about heart disease, diabetes, and high blood pressure? Blood pressure and heart disease  High blood pressure causes heart disease and increases the risk of stroke. This is more likely to develop in people who have high blood pressure readings, are of African descent, or are overweight.  Talk with your health care provider about your target blood pressure readings.  Have your blood pressure checked: ? Every 3-5 years if you are 54-70 years of age. ? Every year if you are 13 years old or older.  If you are between the ages of 83 and 7 and are a current or former smoker, ask your health care provider if you should have a one-time screening for abdominal aortic aneurysm (AAA). Diabetes Have regular diabetes screenings. This checks your fasting blood sugar level. Have the screening done:  Once every three years after age 54 if you are at a normal weight and have a low risk for diabetes.  More often and at a younger age if you are overweight or have a high risk for diabetes. What should I know about preventing infection? Hepatitis B If you have a higher risk for hepatitis B, you should be  screened for this virus. Talk with your health care provider to find out if you are at risk for hepatitis B infection. Hepatitis C Blood testing is recommended for:  Everyone born from [birth year] through 1964.  Anyone with known risk factors for hepatitis C. Sexually transmitted infections (STIs)  You should be screened each year for STIs, including gonorrhea and chlamydia, if: ? You are sexually active and are younger than 42 years of age. ? You are older  than 42 years of age and your health care provider tells you that you are at risk for this type of infection. ? Your sexual activity has changed since you were last screened, and you are at increased risk for chlamydia or gonorrhea. Ask your health care provider if you are at risk.  Ask your health care provider about whether you are at high risk for HIV. Your health care provider may recommend a prescription medicine to help prevent HIV infection. If you choose to take medicine to prevent HIV, you should first get tested for HIV. You should then be tested every 3 months for as long as you are taking the medicine. Follow these instructions at home: Lifestyle  Do not use any products that contain nicotine or tobacco, such as cigarettes, e-cigarettes, and chewing tobacco. If you need help quitting, ask your health care provider.  Do not use street drugs.  Do not share needles.  Ask your health care provider for help if you need support or information about quitting drugs. Alcohol use  Do not drink alcohol if your health care provider tells you not to drink.  If you drink alcohol: ? Limit how much you have to 0-2 drinks a day. ? Be aware of how much alcohol is in your drink. In the [place] one drink equals one 12 oz bottle of beer (355 mL), one 5 oz glass of wine (148 mL), or one 1 oz glass of hard liquor (44 mL). General instructions  Schedule regular health, dental, and eye exams.  Stay current with your vaccines.  Tell your health care provider if: ? You often feel depressed. ? You have ever been abused or do not feel safe at home. Summary  Adopting a healthy lifestyle and getting preventive care are important in promoting health and wellness.  Follow your health care provider's instructions about healthy diet, exercising, and getting tested or screened for diseases.  Follow your health care provider's instructions on monitoring your cholesterol and blood pressure. This  information is not intended to replace advice given to you by your health care provider. Make sure you discuss any questions you have with your health care provider. Document Released: 09/06/2007 Document Revised: 03/03/2018 Document Reviewed: 03/03/2018 Elsevier Patient Education  2020 [employer].

## 2019-03-01 ENCOUNTER — Other Ambulatory Visit (INDEPENDENT_AMBULATORY_CARE_PROVIDER_SITE_OTHER): Payer: BC Managed Care – PPO

## 2019-03-01 DIAGNOSIS — K921 Melena: Secondary | ICD-10-CM

## 2019-03-01 LAB — FECAL OCCULT BLOOD, GUAIAC: Fecal Occult Blood: NEGATIVE

## 2019-03-02 LAB — FECAL OCCULT BLOOD, IMMUNOCHEMICAL: Fecal Occult Bld: NEGATIVE

## 2019-03-03 ENCOUNTER — Encounter: Payer: Self-pay | Admitting: Family Medicine

## 2019-03-12 ENCOUNTER — Other Ambulatory Visit: Payer: Self-pay | Admitting: Family Medicine

## 2019-04-20 ENCOUNTER — Other Ambulatory Visit: Payer: Self-pay | Admitting: Family Medicine

## 2019-05-10 ENCOUNTER — Other Ambulatory Visit: Payer: Self-pay | Admitting: Family Medicine

## 2019-07-07 ENCOUNTER — Ambulatory Visit: Payer: BC Managed Care – PPO | Attending: Internal Medicine

## 2019-07-07 DIAGNOSIS — Z23 Encounter for immunization: Secondary | ICD-10-CM

## 2019-08-02 ENCOUNTER — Ambulatory Visit: Payer: Self-pay

## 2019-08-03 ENCOUNTER — Ambulatory Visit: Payer: Self-pay

## 2019-08-03 LAB — HM DIABETES EYE EXAM

## 2019-08-08 ENCOUNTER — Ambulatory Visit: Payer: BC Managed Care – PPO | Attending: Internal Medicine

## 2019-08-08 DIAGNOSIS — Z23 Encounter for immunization: Secondary | ICD-10-CM

## 2019-08-08 NOTE — Progress Notes (Signed)
   Covid-19 Vaccination [facility]  Name:  Todd Braun    MRN: [id number] DOB: 12-19-1975  08/08/2019  Mr. Schuermann was observed post Covid-19 immunization for 15 minutes without incident. He was provided with Vaccine Information Sheet and instruction to access the V-Safe system.   Mr. Balandran was instructed to call 911 with any severe reactions post vaccine: Marland Kitchen Difficulty breathing  . Swelling of face and throat  . A fast heartbeat  . A bad rash all over body  . Dizziness and weakness   Immunizations Administered    Name Date Dose VIS Date Route   Pfizer COVID-19 Vaccine 08/08/2019 11:57 AM 0.3 mL 05/18/2018 Intramuscular   Manufacturer: [place]   Lot: [id number]   [place]: [id number]

## 2019-08-12 ENCOUNTER — Encounter: Payer: Self-pay | Admitting: Family Medicine

## 2019-08-24 ENCOUNTER — Encounter: Payer: Self-pay | Admitting: Family Medicine

## 2019-08-24 ENCOUNTER — Encounter: Payer: Self-pay | Admitting: Gastroenterology

## 2019-08-24 ENCOUNTER — Ambulatory Visit (INDEPENDENT_AMBULATORY_CARE_PROVIDER_SITE_OTHER): Payer: BC Managed Care – PPO | Admitting: Family Medicine

## 2019-08-24 ENCOUNTER — Other Ambulatory Visit: Payer: Self-pay

## 2019-08-24 VITALS — BP 118/80 | HR 85 | Temp 97.9°F | Ht 63.0 in | Wt 225.5 lb

## 2019-08-24 DIAGNOSIS — K7581 Nonalcoholic steatohepatitis (NASH): Secondary | ICD-10-CM | POA: Diagnosis not present

## 2019-08-24 DIAGNOSIS — K921 Melena: Secondary | ICD-10-CM | POA: Diagnosis not present

## 2019-08-24 DIAGNOSIS — E1140 Type 2 diabetes mellitus with diabetic neuropathy, unspecified: Secondary | ICD-10-CM

## 2019-08-24 DIAGNOSIS — IMO0002 Reserved for concepts with insufficient information to code with codable children: Secondary | ICD-10-CM

## 2019-08-24 DIAGNOSIS — E1169 Type 2 diabetes mellitus with other specified complication: Secondary | ICD-10-CM

## 2019-08-24 DIAGNOSIS — E1165 Type 2 diabetes mellitus with hyperglycemia: Secondary | ICD-10-CM | POA: Diagnosis not present

## 2019-08-24 DIAGNOSIS — R109 Unspecified abdominal pain: Secondary | ICD-10-CM | POA: Insufficient documentation

## 2019-08-24 DIAGNOSIS — E782 Mixed hyperlipidemia: Secondary | ICD-10-CM

## 2019-08-24 LAB — POCT GLYCOSYLATED HEMOGLOBIN (HGB A1C): Hemoglobin A1C: 7.8 % — AB (ref 4.0–5.6)

## 2019-08-24 MED ORDER — OZEMPIC (0.25 OR 0.5 MG/DOSE) 2 MG/1.5ML ~~LOC~~ SOPN: 0.2500 mg | PEN_INJECTOR | SUBCUTANEOUS | 6 refills | Status: DC

## 2019-08-24 NOTE — Assessment & Plan Note (Signed)
Deteriorated since off ozempic. Agrees to restart 0.58m weekly dose. Continue other regimen. reassess at 6 mo CPE

## 2019-08-24 NOTE — Assessment & Plan Note (Signed)
Ongoing blood in stool with spicy foods. Known hemorrhoid but not inflamed when last checked. Given ongoing bleeding, prudent for GI eval - referral placed.

## 2019-08-24 NOTE — Progress Notes (Signed)
This visit was conducted in person.  BP 118/80 (BP Location: Left Arm, Patient Position: Sitting, Cuff Size: Large)   Pulse 85   Temp 97.9 F (36.6 C) (Temporal)   Ht 5' 3"  (1.6 m)   Wt 225 lb 8 oz (102.3 kg)   SpO2 97%   BMI 39.95 kg/m    CC: DM 6 mo f/u visit  Subjective:    Patient ID: Todd Braun, male    DOB: Oct 17, 1975, 44 y.o.   MRN: [id number]  HPI: Damon Hargrove is a 44 y.o. male presenting on 08/24/2019 for Diabetes (Here for 6 mo f/u.)   Completed Pfizer COVID vaccine series 2 wks ago.  Ongoing intermittent blood in stool noted after spicy foods. He has been trying to avoid this. Notes worse when constipated. Some stool leakage after BM. No melena.   HLD - compliant with fenofibrate and atorvastatin daily without myalgias.   DM - does not regularly check sugars, fasting today 130. Compliant with antihyperglycemic regimen which includes: farxiga 23m daily, amaryl 418mdaily, and actos 3011maily. He stopped weekly ozempic 2 months ago - causing R side pain. Was on 1mg79mried 0.5mg 51mhout improvement. Denies low sugars or hypoglycemic symptoms. Denies paresthesias. Last diabetic eye exam 07/2019. Pneumovax: DUE. Prevnar: not due. Glucometer brand: thinks LG brand. DSME: previously declined. Lab Results  Component Value Date   HGBA1C 7.8 (A) 08/24/2019   Diabetic Foot Exam - Simple   Simple Foot Form Diabetic Foot exam was performed with the following findings: Yes 08/24/2019 10:29 AM  Visual Inspection No deformities, no ulcerations, no other skin breakdown bilaterally: Yes Sensation Testing Intact to touch and monofilament testing bilaterally: Yes Pulse Check Posterior Tibialis and Dorsalis pulse intact bilaterally: Yes Comments Slightly diminished pulses    Lab Results  Component Value Date   MICROALBUR <0.7 02/15/2019         Relevant past medical, surgical, family and social history reviewed and updated as indicated. Interim medical history since our last visit  reviewed. Allergies and medications reviewed and updated. Outpatient Medications Prior to Visit  Medication Sig Dispense Refill  . atorvastatin (LIPITOR) 40 MG tablet TAKE 1 TABLET DAILY 90 tablet 3  . Blood Glucose Monitoring Suppl (BLOOD GLUCOSE METER KIT AND SUPPLIES) Use as instructed 1 each 0  . etodolac (LODINE) 500 MG tablet Take 500 mg by mouth 2 (two) times daily as needed.    . FARMarland KitchenIGA 5 MG TABS tablet TAKE 1 TABLET BY MOUTH EVERY DAY 30 tablet 6  . fenofibrate (TRICOR) 145 MG tablet TAKE 1 TABLET DAILY 90 tablet 3  . glimepiride (AMARYL) 4 MG tablet Take 1 tablet (4 mg total) by mouth daily. 90 tablet 3  . glucose blood test strip Check 1-2 times daily and PRN E11.40. LG brand meter. 180 each 3  . Lancets 30G MISC 1 each by Does not apply route daily. 100 each 3  . NEOMYCIN-POLYMYXIN-HYDROCORTISONE (CORTISPORIN) 1 % SOLN OTIC solution Place 3 drops into the right ear every 8 (eight) hours. 10 mL 0  . pioglitazone (ACTOS) 30 MG tablet TAKE 1 TABLET DAILY 90 tablet 3  . traMADol (ULTRAM) 50 MG tablet Take 1 tablet (50 mg total) by mouth 2 (two) times daily as needed. 40 tablet 0  . OZEMPIC, 1 MG/DOSE, 2 MG/1.5ML SOPN INJECT 1 MG INTO THE SKIN ONCE A WEEK. (Patient not taking: Reported on 08/24/2019) 6 pen 1   No facility-administered medications prior to visit.     Per  HPI unless specifically indicated in ROS section below Review of Systems Objective:  BP 118/80 (BP Location: Left Arm, Patient Position: Sitting, Cuff Size: Large)   Pulse 85   Temp 97.9 F (36.6 C) (Temporal)   Ht 5' 3"  (1.6 m)   Wt 225 lb 8 oz (102.3 kg)   SpO2 97%   BMI 39.95 kg/m   Wt Readings from Last 3 Encounters:  08/24/19 225 lb 8 oz (102.3 kg)  02/22/19 224 lb 9 oz (101.9 kg)  07/15/18 218 lb (98.9 kg)      Physical Exam Vitals and nursing note reviewed.  Constitutional:      General: He is not in acute distress.    Appearance: Normal appearance. He is well-developed. He is obese. He is not  ill-appearing.  HENT:     Head: Normocephalic and atraumatic.     Left Ear: External ear normal.  Eyes:     General: No scleral icterus.    Extraocular Movements: Extraocular movements intact.     Conjunctiva/sclera: Conjunctivae normal.     Pupils: Pupils are equal, round, and reactive to light.  Cardiovascular:     Rate and Rhythm: Normal rate and regular rhythm.     Pulses: Normal pulses.     Heart sounds: Normal heart sounds. No murmur.  Pulmonary:     Effort: Pulmonary effort is normal. No respiratory distress.     Breath sounds: Normal breath sounds. No wheezing, rhonchi or rales.  Abdominal:     General: Abdomen is flat. Bowel sounds are normal. There is no distension.     Palpations: Abdomen is soft. There is no mass.     Tenderness: There is no abdominal tenderness. There is no right CVA tenderness, left CVA tenderness, guarding or rebound.     Hernia: No hernia is present.  Musculoskeletal:     Cervical back: Normal range of motion and neck supple.     Right lower leg: No edema.     Left lower leg: No edema.     Comments: See HPI for foot exam if done  Lymphadenopathy:     Cervical: No cervical adenopathy.  Skin:    General: Skin is warm and dry.     Findings: Rash present.     Comments: Hyperpigmented clustered macules to RUQ, no other rash.   Psychiatric:        Mood and Affect: Mood normal.        Behavior: Behavior normal.       Results for orders placed or performed in visit on 08/24/19  POCT glycosylated hemoglobin (Hb A1C)  Result Value Ref Range   Hemoglobin A1C 7.8 (A) 4.0 - 5.6 %   HbA1c POC (<> result, manual entry)     HbA1c, POC (prediabetic range)     HbA1c, POC (controlled diabetic range)     Assessment & Plan:  This visit occurred during the SARS-CoV-2 public health emergency.  Safety protocols were in place, including screening questions prior to the visit, additional usage of staff PPE, and extensive cleaning of exam room while observing  appropriate contact time as indicated for disinfecting solutions.   Problem List Items Addressed This Visit    Uncontrolled type 2 diabetes with neuropathy ([place]) - Primary    Deteriorated since off ozempic. Agrees to restart 0.50m weekly dose. Continue other regimen. reassess at 6 mo CPE      Relevant Medications   Semaglutide,0.25 or 0.5MG/DOS, (OZEMPIC, 0.25 OR 0.5 MG/DOSE,) 2 MG/1.5ML SOPN  Other Relevant Orders   POCT glycosylated hemoglobin (Hb A1C) (Completed)   Right sided abdominal pain    ?GLPR1 related vs shingles with cluster of hyperpigmented macules to RUQ. No residual pain.  Suggested retrial ozempic.       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       Relevant Medications   Semaglutide,0.25 or 0.5MG/DOS, (OZEMPIC, 0.25 OR 0.5 MG/DOSE,) 2 MG/1.5ML SOPN   Blood in stool    Ongoing blood in stool with spicy foods. Known hemorrhoid but not inflamed when last checked. Given ongoing bleeding, prudent for GI eval - referral placed.       Relevant Orders   Ambulatory referral to Gastroenterology       Meds ordered this encounter  Medications  . Semaglutide,0.25 or 0.5MG/DOS, (OZEMPIC, 0.25 OR 0.5 MG/DOSE,) 2 MG/1.5ML SOPN    Sig: Inject 0.25 mg into the skin once a week.    Dispense:  1 pen    Refill:  6    Note new dose   Orders Placed This Encounter  Procedures  . Ambulatory referral to Gastroenterology    Referral Priority:   Routine    Referral Type:   Consultation    Referral Reason:   Specialty Services Required    Number of Visits Requested:   1  . POCT glycosylated hemoglobin (Hb A1C)    Patient Instructions  You are doing well today Sugar was higher than goal.  Restart ozempic 0.53m weekly. Side pain may be due to ozempic, ?shingles related pain given scars from rash on  right side.  We will refer you to GI for further evaluation of blood in stool.  Return in 6 months for physical.    Follow up plan: Return in about 6 months (around 02/23/2020) for annual exam, prior fasting for blood work.  JRia Bush MD

## 2019-08-24 NOTE — Patient Instructions (Signed)
You are doing well today Sugar was higher than goal.  Restart ozempic 0.18m weekly. Side pain may be due to ozempic, ?shingles related pain given scars from rash on right side.  We will refer you to GI for further evaluation of blood in stool.  Return in 6 months for physical.

## 2019-08-24 NOTE — Assessment & Plan Note (Signed)
?  GLPR1 related vs shingles with cluster of hyperpigmented macules to RUQ. No residual pain.  Suggested retrial ozempic.

## 2019-08-24 NOTE — Assessment & Plan Note (Signed)
Stable period on statin and fibrate - continue.

## 2019-08-28 ENCOUNTER — Other Ambulatory Visit: Payer: Self-pay | Admitting: Family Medicine

## 2019-09-28 DIAGNOSIS — J069 Acute upper respiratory infection, unspecified: Secondary | ICD-10-CM | POA: Diagnosis not present

## 2019-09-28 DIAGNOSIS — J029 Acute pharyngitis, unspecified: Secondary | ICD-10-CM | POA: Diagnosis not present

## 2019-09-28 DIAGNOSIS — R05 Cough: Secondary | ICD-10-CM | POA: Diagnosis not present

## 2019-09-28 DIAGNOSIS — E119 Type 2 diabetes mellitus without complications: Secondary | ICD-10-CM | POA: Diagnosis not present

## 2019-10-01 DIAGNOSIS — Z20822 Contact with and (suspected) exposure to covid-19: Secondary | ICD-10-CM | POA: Diagnosis not present

## 2019-10-01 DIAGNOSIS — Z03818 Encounter for observation for suspected exposure to other biological agents ruled out: Secondary | ICD-10-CM | POA: Diagnosis not present

## 2019-10-12 ENCOUNTER — Ambulatory Visit (INDEPENDENT_AMBULATORY_CARE_PROVIDER_SITE_OTHER): Payer: BC Managed Care – PPO | Admitting: Gastroenterology

## 2019-10-12 ENCOUNTER — Encounter: Payer: Self-pay | Admitting: Family Medicine

## 2019-10-12 ENCOUNTER — Encounter: Payer: Self-pay | Admitting: Gastroenterology

## 2019-10-12 VITALS — BP 116/80 | HR 100 | Ht 63.0 in | Wt 221.0 lb

## 2019-10-12 DIAGNOSIS — K625 Hemorrhage of anus and rectum: Secondary | ICD-10-CM | POA: Diagnosis not present

## 2019-10-12 DIAGNOSIS — R194 Change in bowel habit: Secondary | ICD-10-CM

## 2019-10-12 NOTE — Progress Notes (Signed)
[facility] Consult Note:  History: Todd Braun 10/12/2019  Referring provider: Ria Bush, MD  Reason for consult/chief complaint: Blood In Stools (x 1 year; occurs whenever he eats hot peppers; notes constipation only with hot peppers; no rectal pain; no abdominal pain)   Subjective  HPI: From PCP visit 08/24/19: "Todd Braun is a 45 y.o. male presenting on 08/24/2019 for Diabetes (Here for 6 mo f/u.)     Completed Pfizer COVID vaccine series 2 wks ago.   Ongoing intermittent blood in stool noted after spicy foods. He has been trying to avoid this. Notes worse when constipated. Some stool leakage after BM. No melena.    HLD - compliant with fenofibrate and atorvastatin daily without myalgias.    DM - does not regularly check sugars, fasting today 130. Compliant with antihyperglycemic regimen which includes: farxiga 51m daily, amaryl 453mdaily, and actos 3020maily. He stopped weekly ozempic 2 months ago - causing R side pain. Was on 1mg70mried 0.5mg 45mhout improvement. Denies low sugars or hypoglycemic symptoms. Denies paresthesias"  Patient had RTI symptoms and tested COVID negative [facility] 10/01/19 __________________________ This is a very pleasant 43 ye29 old man referred for rectal bleeding.  For least the last year he would get painless rectal bleeding in the bowl or paper after eating spicy foods.  It would seem to only occur with this, and having avoided those foods for the last couple of months the problem has stopped.  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  He denies chronic abdominal pain, nausea, vomiting, early satiety or weight change.   ROS:  Review of Systems  Constitutional: Negative for appetite change and unexpected weight change.  HENT:  Negative for mouth sores and voice change.   Eyes: Negative for pain and redness.  Respiratory: Negative for cough and shortness of breath.   Cardiovascular: Negative for chest pain and palpitations.  Genitourinary: Negative for dysuria and hematuria.  Musculoskeletal: Negative for arthralgias and myalgias.  Skin: Negative for pallor and rash.  Neurological: Negative for weakness and headaches.  Hematological: Negative for adenopathy.     Past Medical History: Past Medical History:  Diagnosis Date  . Gallbladder polyp 05/2013  . Gout   . HLD (hyperlipidemia)   . NASH (nonalcoholic steatohepatitis) 05/2013   with transaminitis  . Obesity   . T2DM (type 2 diabetes mellitus) (HCC) [place]  Past Surgical History: Past Surgical History:  Procedure Laterality Date  . APPENDECTOMY  1996  . FOOT SURGERY  2013   right for arthritis  . TONSILLECTOMY  childhood     Family History: Family History  Problem Relation Age of Onset  . Diabetes Father   . CAD Neg Hx   . Stroke Neg Hx   . Cancer Neg Hx   . Colon cancer Neg Hx   . Esophageal cancer Neg Hx   . Pancreatic cancer Neg Hx   . Stomach cancer Neg Hx   . Liver disease Neg Hx     Social History: Social History   Socioeconomic History  . Marital status: Married    Spouse name: Not on file  . Number of children: Not on file  . Years of education: Not on file  . Highest education level: Not  on file  Occupational History  . Not on file  Tobacco Use  . Smoking status: Former Research scientist (life sciences)  . Smokeless tobacco: Never Used  . Tobacco comment: "smoked occasionally a long time ago"  Substance and Sexual Activity  . Alcohol use: Yes    Comment: Socially-7 drinks per week  . Drug use: No  . Sexual activity: Not on file  Other Topics Concern  . Not on file  Social History Narrative   Caffeine: 3-4 cups/night coffee   Lives with wife, son (2005) and twins (2012), 1 outside cat   Occupation: toolmaker   Edu: HS   Activity:  walking twice weekly for 30 min   Diet: good water, fruits/vegetables occasionally   Social Determinants of Radio broadcast assistant Strain:   . Difficulty of Paying Living Expenses:   Food Insecurity:   . Worried About Charity fundraiser in the Last Year:   . Arboriculturist in the Last Year:   Transportation Needs:   . Film/video editor (Medical):   Marland Kitchen Lack of Transportation (Non-Medical):   Physical Activity:   . Days of Exercise per Week:   . Minutes of Exercise per Session:   Stress:   . Feeling of Stress :   Social Connections:   . Frequency of Communication with Friends and Family:   . Frequency of Social Gatherings with Friends and Family:   . Attends Religious Services:   . Active Member of Clubs or Organizations:   . Attends Archivist Meetings:   Marland Kitchen Marital Status:     Allergies: Allergies  Allergen Reactions  . Metformin And Related Diarrhea    IR formulation caused GI upset and diarrhea to point of hematochezia    Outpatient Meds: Current Outpatient Medications  Medication Sig Dispense Refill  . atorvastatin (LIPITOR) 40 MG tablet TAKE 1 TABLET DAILY 90 tablet 3  . Blood Glucose Monitoring Suppl (BLOOD GLUCOSE METER KIT AND SUPPLIES) Use as instructed 1 each 0  . FARXIGA 5 MG TABS tablet TAKE 1 TABLET BY MOUTH EVERY DAY 30 tablet 6  . fenofibrate (TRICOR) 145 MG tablet TAKE 1 TABLET DAILY 90 tablet 3  . glimepiride (AMARYL) 4 MG tablet Take 1 tablet (4 mg total) by mouth daily. 90 tablet 3  . glucose blood test strip Check 1-2 times daily and PRN E11.40. LG brand meter. 180 each 3  . Lancets 30G MISC 1 each by Does not apply route daily. 100 each 3  . pioglitazone (ACTOS) 30 MG tablet TAKE 1 TABLET DAILY 90 tablet 3  . Semaglutide,0.25 or 0.5MG/DOS, (OZEMPIC, 0.25 OR 0.5 MG/DOSE,) 2 MG/1.5ML SOPN Inject 0.25 mg into the skin once a week. 1 pen 6  . traMADol (ULTRAM) 50 MG tablet Take 1 tablet (50 mg total) by mouth 2 (two) times daily as  needed. 40 tablet 0   No current facility-administered medications for this visit.      ___________________________________________________________________ Objective   Exam:  BP 116/80   Pulse 100   Ht _0  (1.6 m)   Wt 221 lb (100.2 kg)   BMI 39.15 kg/m    General: Well-appearing  Eyes: sclera anicteric, no redness  ENT: oral mucosa moist without lesions, no cervical or supraclavicular lymphadenopathy  CV: RRR without murmur, S1/S2, no JVD, no peripheral edema  Resp: clear to auscultation bilaterally, normal RR and effort noted  GI: soft, obese, no tenderness, with active bowel sounds. No guarding or palpable organomegaly noted (limited  by body habitus).  Skin; warm and dry, no rash or jaundice noted  Neuro: awake, alert and oriented x 3. Normal gross motor function and fluent speech  Labs:  Last CBC normal Jan 2019 Recent Hgb A1c 7.8   Assessment: Encounter Diagnoses  Name Primary?  . Rectal bleeding Yes  . Altered bowel habits     Intermittent painless rectal bleeding, probably occurring with some episodic change in bowel habits.  Seems to have certain food triggers by his report.  Is lately stopped, but cause unclear.  Perhaps hemorrhoidal related to some bowel habit changes triggered by certain foods.  Nevertheless, I recommended a colonoscopy to understand the cause and determine if any further treatment needed.  He was agreeable after discussion of procedure and risks.  The benefits and risks of the planned procedure were described in detail with the patient or (when appropriate) their health care proxy.  Risks were outlined as including, but not limited to, bleeding, infection, perforation, adverse medication reaction leading to cardiac or pulmonary decompensation, pancreatitis (if ERCP).  The limitation of incomplete mucosal visualization was also discussed.  No guarantees or warranties were given.    Thank you for the courtesy of this consult.  Please  call me with any questions or concerns.  Nelida Meuse III  CC: Referring provider noted above

## 2019-10-12 NOTE — Patient Instructions (Signed)
If you are age 43 or older, your body mass index should be between 23-30. Your Body mass index is 39.15 kg/m. If this is out of the aforementioned range listed, please consider follow up with your Primary Care Provider.  If you are age 33 or younger, your body mass index should be between 19-25. Your Body mass index is 39.15 kg/m. If this is out of the aformentioned range listed, please consider follow up with your Primary Care Provider.    You have been scheduled for a colonoscopy. Please follow written instructions given to you at your visit today.  Please pick up your prep supplies at the pharmacy within the next 1-3 days. If you use inhalers (even only as needed), please bring them with you on the day of your procedure.  It was a pleasure to see you today!  Dr. Loletha Carrow

## 2019-10-17 ENCOUNTER — Other Ambulatory Visit: Payer: Self-pay | Admitting: Family Medicine

## 2019-10-17 NOTE — Telephone Encounter (Signed)
Patient was last seen in the office on 08/24/19. Rx was last refilled on 03/12/19 for #30 with 6 refills. Ok to refill?

## 2019-10-23 HISTORY — PX: COLONOSCOPY: SHX174

## 2019-10-31 ENCOUNTER — Encounter: Payer: Self-pay | Admitting: Gastroenterology

## 2019-11-07 ENCOUNTER — Ambulatory Visit (AMBULATORY_SURGERY_CENTER): Payer: BC Managed Care – PPO | Admitting: Gastroenterology

## 2019-11-07 ENCOUNTER — Other Ambulatory Visit: Payer: Self-pay

## 2019-11-07 ENCOUNTER — Encounter: Payer: Self-pay | Admitting: Gastroenterology

## 2019-11-07 VITALS — BP 105/71 | HR 80 | Temp 98.2°F | Resp 21 | Ht 63.0 in | Wt 221.0 lb

## 2019-11-07 DIAGNOSIS — D124 Benign neoplasm of descending colon: Secondary | ICD-10-CM | POA: Diagnosis not present

## 2019-11-07 DIAGNOSIS — K625 Hemorrhage of anus and rectum: Secondary | ICD-10-CM

## 2019-11-07 DIAGNOSIS — R195 Other fecal abnormalities: Secondary | ICD-10-CM | POA: Diagnosis not present

## 2019-11-07 MED ORDER — SODIUM CHLORIDE 0.9 % IV SOLN: 500.0000 mL | Freq: Once | INTRAVENOUS | Status: DC

## 2019-11-07 NOTE — Patient Instructions (Signed)
Information on polyps given to you today.  Await pathology results,  Resume previous diet and medications.  YOU HAD AN ENDOSCOPIC PROCEDURE TODAY AT [facility]:   Refer to the procedure report that was given to you for any specific questions about what was found during the examination.  If the procedure report does not answer your questions, please call your gastroenterologist to clarify.  If you requested that your care partner not be given the details of your procedure findings, then the procedure report has been included in a sealed envelope for you to review at your convenience later.  YOU SHOULD EXPECT: Some feelings of bloating in the abdomen. Passage of more gas than usual.  Walking can help get rid of the air that was put into your GI tract during the procedure and reduce the bloating. If you had a lower endoscopy (such as a colonoscopy or flexible sigmoidoscopy) you may notice spotting of blood in your stool or on the toilet paper. If you underwent a bowel prep for your procedure, you may not have a normal bowel movement for a few days.  Please Note:  You might notice some irritation and congestion in your nose or some drainage.  This is from the oxygen used during your procedure.  There is no need for concern and it should clear up in a day or so.  SYMPTOMS TO REPORT IMMEDIATELY:   Following lower endoscopy (colonoscopy or flexible sigmoidoscopy):  Excessive amounts of blood in the stool  Significant tenderness or worsening of abdominal pains  Swelling of the abdomen that is new, acute  Fever of 100F or higher  For urgent or emergent issues, a gastroenterologist can be reached at any hour by calling [phone]. Do not use MyChart messaging for urgent concerns.    DIET:  We do recommend a small meal at first, but then you may proceed to your regular diet.  Drink plenty of fluids but you should avoid alcoholic beverages for 24 hours.  ACTIVITY:  You should  plan to take it easy for the rest of today and you should NOT DRIVE or use heavy machinery until tomorrow (because of the sedation medicines used during the test).    FOLLOW UP: Our staff will call the number listed on your records 48-72 hours following your procedure to check on you and address any questions or concerns that you may have regarding the information given to you following your procedure. If we do not reach you, we will leave a message.  We will attempt to reach you two times.  During this call, we will ask if you have developed any symptoms of COVID 19. If you develop any symptoms (ie: fever, flu-like symptoms, shortness of breath, cough etc.) before then, please call [phone].  If you test positive for Covid 19 in the 2 weeks post procedure, please call and report this information to [place].    If any biopsies were taken you will be contacted by phone or by letter within the next 1-3 weeks.  Please call us at [phone] if you have not heard about the biopsies in 3 weeks.    SIGNATURES/CONFIDENTIALITY: You and/or your care partner have signed paperwork which will be entered into your electronic medical record.  These signatures attest to the fact that that the information above on your After Visit Summary has been reviewed and is understood.  Full responsibility of the confidentiality of this discharge information lies with you and/or your care-partner.

## 2019-11-07 NOTE — Progress Notes (Signed)
VS-CW

## 2019-11-07 NOTE — Progress Notes (Signed)
To PACU, VSS. Report to [email or web address] 

## 2019-11-07 NOTE — Progress Notes (Signed)
Called to room to assist during endoscopic procedure.  Patient ID and intended procedure confirmed with present staff. Received instructions for my participation in the procedure from the performing physician.  

## 2019-11-07 NOTE — Op Note (Signed)
[name] Procedure Date: 11/07/2019 10:19 AM MRN: [id number] Endoscopist: Mallie Mussel L. Loletha Carrow , MD Age: 44 Referring MD:  Date of Birth: [birth year] Gender: Male Account #: [id number] Procedure:                Colonoscopy Indications:              Rectal bleeding (episodic, when eating spicy food,                            which may cause brief change bowel habits) Medicines:                Monitored Anesthesia Care Procedure:                Pre-Anesthesia Assessment:                           - Prior to the procedure, a History and Physical                            was performed, and patient medications and                            allergies were reviewed. The patient's tolerance of                            previous anesthesia was also reviewed. The risks                            and benefits of the procedure and the sedation                            options and risks were discussed with the patient.                            All questions were answered, and informed consent                            was obtained. Prior Anticoagulants: The patient has                            taken no previous anticoagulant or antiplatelet                            agents. ASA Grade Assessment: III - A patient with                            severe systemic disease. After reviewing the risks                            and benefits, the patient was deemed in                            satisfactory condition to undergo the procedure.  After obtaining informed consent, the colonoscope                            was passed under direct vision. Throughout the                            procedure, the patient's blood pressure, pulse, and                            oxygen saturations were monitored continuously. The                            Colonoscope was introduced through the anus and                            advanced to the the  terminal ileum, with                            identification of the appendiceal orifice and IC                            valve. The colonoscopy was performed without                            difficulty. The patient tolerated the procedure                            well. The quality of the bowel preparation was                            good. The terminal ileum, ileocecal valve,                            appendiceal orifice, and rectum were photographed.                            The bowel preparation used was Miralax. Scope In: 10:28:21 AM Scope Out: 10:41:57 AM Scope Withdrawal Time: 0 hours 8 minutes 38 seconds  Total Procedure Duration: 0 hours 13 minutes 36 seconds  Findings:                 The perianal and digital rectal examinations were                            normal.                           A diminutive polyp was found in the descending                            colon. The polyp was sessile. The polyp was removed                            with a cold snare. Resection and retrieval were  complete.                           The exam was otherwise without abnormality on                            direct and retroflexion views. Complications:            No immediate complications. Estimated Blood Loss:     Estimated blood loss was minimal. Impression:               - One diminutive polyp in the descending colon,                            removed with a cold snare. Resected and retrieved.                           - The examination was otherwise normal on direct                            and retroflexion views.                           Benign episodic ano-rectal bleeding. Recommendation:           - Patient has a contact number available for                            emergencies. The signs and symptoms of potential                            delayed complications were discussed with the                            patient. Return to  normal activities tomorrow.                            Written discharge instructions were provided to the                            patient.                           - Resume previous diet.                           - Continue present medications.                           - Await pathology results.                           - Repeat colonoscopy is recommended for                            surveillance. The colonoscopy date will be  determined after pathology results from today's                            exam become available for review. Sajad Glander L. Loletha Carrow, MD 11/07/2019 10:46:05 AM This report has been signed electronically.

## 2019-11-09 ENCOUNTER — Encounter: Payer: Self-pay | Admitting: Gastroenterology

## 2019-11-09 ENCOUNTER — Telehealth: Payer: Self-pay

## 2019-11-09 NOTE — Telephone Encounter (Signed)
  Follow up Call-  Call back number 11/07/2019  Post procedure Call Back phone  # [place] to leave phone message Yes  Some recent data might be hidden     Patient questions:  Do you have a fever, pain , or abdominal swelling? No. Pain Score  0 *  Have you tolerated food without any problems? Yes.    Have you been able to return to your normal activities? Yes.    Do you have any questions about your discharge instructions: Diet   No. Medications  No. Follow up visit  No.  Do you have questions or concerns about your Care? No.  Actions: * If pain score is 4 or above: No action needed, pain <4.  1. Have you developed a fever since your procedure? no  2.   Have you had an respiratory symptoms (SOB or cough) since your procedure? no  3.   Have you tested positive for COVID 19 since your procedure no  4.   Have you had any family members/close contacts diagnosed with the COVID 19 since your procedure?  No   I spoke with Pt's wife Todd Braun, as directed in follow up call info. maw   If yes to any of these questions please route to Joylene John, RN and Joella Prince, RN

## 2019-11-10 ENCOUNTER — Encounter: Payer: Self-pay | Admitting: Family Medicine

## 2019-12-05 ENCOUNTER — Encounter: Payer: BC Managed Care – PPO | Admitting: Gastroenterology

## 2020-01-26 ENCOUNTER — Encounter: Payer: Self-pay | Admitting: Family Medicine

## 2020-01-26 NOTE — Telephone Encounter (Signed)
According to chart, rx sent on 08/24/19, #1 pen/6.    I spoke with [facility], [address] asking about rx.  States they had 2 on filed 1 mg and 0.25.  Informed them pt was last instructed to take 0.25 mg weekly.  Told that rx was on hold.  Says they will have to order med but should have available tomorrow and will fill for pt.    Notified pt via [facility] will fill rx.

## 2020-02-19 ENCOUNTER — Other Ambulatory Visit: Payer: Self-pay | Admitting: Family Medicine

## 2020-02-19 DIAGNOSIS — IMO0002 Reserved for concepts with insufficient information to code with codable children: Secondary | ICD-10-CM

## 2020-02-19 DIAGNOSIS — E782 Mixed hyperlipidemia: Secondary | ICD-10-CM

## 2020-02-19 DIAGNOSIS — M1A9XX0 Chronic gout, unspecified, without tophus (tophi): Secondary | ICD-10-CM

## 2020-02-19 DIAGNOSIS — E1140 Type 2 diabetes mellitus with diabetic neuropathy, unspecified: Secondary | ICD-10-CM

## 2020-02-19 DIAGNOSIS — E1169 Type 2 diabetes mellitus with other specified complication: Secondary | ICD-10-CM

## 2020-02-19 DIAGNOSIS — K7581 Nonalcoholic steatohepatitis (NASH): Secondary | ICD-10-CM

## 2020-02-21 ENCOUNTER — Other Ambulatory Visit: Payer: Self-pay

## 2020-02-21 ENCOUNTER — Other Ambulatory Visit (INDEPENDENT_AMBULATORY_CARE_PROVIDER_SITE_OTHER): Payer: BC Managed Care – PPO

## 2020-02-21 DIAGNOSIS — E1169 Type 2 diabetes mellitus with other specified complication: Secondary | ICD-10-CM | POA: Diagnosis not present

## 2020-02-21 DIAGNOSIS — M1A9XX0 Chronic gout, unspecified, without tophus (tophi): Secondary | ICD-10-CM

## 2020-02-21 DIAGNOSIS — E782 Mixed hyperlipidemia: Secondary | ICD-10-CM

## 2020-02-21 DIAGNOSIS — E1140 Type 2 diabetes mellitus with diabetic neuropathy, unspecified: Secondary | ICD-10-CM | POA: Diagnosis not present

## 2020-02-21 DIAGNOSIS — K7581 Nonalcoholic steatohepatitis (NASH): Secondary | ICD-10-CM | POA: Diagnosis not present

## 2020-02-21 DIAGNOSIS — IMO0002 Reserved for concepts with insufficient information to code with codable children: Secondary | ICD-10-CM

## 2020-02-21 DIAGNOSIS — E1165 Type 2 diabetes mellitus with hyperglycemia: Secondary | ICD-10-CM

## 2020-02-21 LAB — COMPREHENSIVE METABOLIC PANEL
ALT: 55 U/L — ABNORMAL HIGH (ref 0–53)
AST: 35 U/L (ref 0–37)
Albumin: 4.5 g/dL (ref 3.5–5.2)
Alkaline Phosphatase: 49 U/L (ref 39–117)
BUN: 18 mg/dL (ref 6–23)
CO2: 29 mEq/L (ref 19–32)
Calcium: 9.9 mg/dL (ref 8.4–10.5)
Chloride: 100 mEq/L (ref 96–112)
Creatinine, Ser: 1.05 mg/dL (ref 0.40–1.50)
GFR: 87.09 mL/min (ref >60.00)
Glucose, Bld: 154 mg/dL — ABNORMAL HIGH (ref 70–99)
Potassium: 3.8 mEq/L (ref 3.5–5.1)
Sodium: 137 mEq/L (ref 135–145)
Total Bilirubin: 0.7 mg/dL (ref 0.2–1.2)
Total Protein: 7.8 g/dL (ref 6.0–8.3)

## 2020-02-21 LAB — CBC WITH DIFFERENTIAL/PLATELET
Basophils Absolute: 0 10*3/uL (ref 0.0–0.1)
Basophils Relative: 0.7 % (ref 0.0–3.0)
Eosinophils Absolute: 0.1 10*3/uL (ref 0.0–0.7)
Eosinophils Relative: 1.8 % (ref 0.0–5.0)
HCT: 43.3 % (ref 39.0–52.0)
Hemoglobin: 14.3 g/dL (ref 13.0–17.0)
Lymphocytes Relative: 38 % (ref 12.0–46.0)
Lymphs Abs: 2.4 10*3/uL (ref 0.7–4.0)
MCHC: 33 g/dL (ref 30.0–36.0)
MCV: 85.8 fl (ref 78.0–100.0)
Monocytes Absolute: 0.5 10*3/uL (ref 0.1–1.0)
Monocytes Relative: 7.6 % (ref 3.0–12.0)
Neutro Abs: 3.3 10*3/uL (ref 1.4–7.7)
Neutrophils Relative %: 51.9 % (ref 43.0–77.0)
Platelets: 196 10*3/uL (ref 150.0–400.0)
RBC: 5.05 Mil/uL (ref 4.22–5.81)
RDW: 13.6 % (ref 11.5–15.5)
WBC: 6.3 10*3/uL (ref 4.0–10.5)

## 2020-02-21 LAB — LIPID PANEL
Cholesterol: 171 mg/dL (ref 0–200)
HDL: 33.8 mg/dL — ABNORMAL LOW (ref >39.00)
NonHDL: 136.89
Total CHOL/HDL Ratio: 5
Triglycerides: 220 mg/dL — ABNORMAL HIGH (ref 0.0–149.0)
VLDL: 44 mg/dL — ABNORMAL HIGH (ref 0.0–40.0)

## 2020-02-21 LAB — URIC ACID: Uric Acid, Serum: 6.6 mg/dL (ref 4.0–7.8)

## 2020-02-21 LAB — MICROALBUMIN / CREATININE URINE RATIO
Creatinine,U: 107.4 mg/dL
Microalb Creat Ratio: 0.9 mg/g (ref 0.0–30.0)
Microalb, Ur: 0.9 mg/dL (ref 0.0–1.9)

## 2020-02-21 LAB — HEMOGLOBIN A1C: Hgb A1c MFr Bld: 8.1 % — ABNORMAL HIGH (ref 4.6–6.5)

## 2020-02-21 LAB — LDL CHOLESTEROL, DIRECT: Direct LDL: 105 mg/dL

## 2020-02-21 NOTE — Addendum Note (Signed)
Addended by: Cloyd Stagers on: 02/21/2020 10:06 AM   Modules accepted: Orders

## 2020-02-24 ENCOUNTER — Other Ambulatory Visit: Payer: Self-pay

## 2020-02-24 ENCOUNTER — Encounter: Payer: Self-pay | Admitting: Family Medicine

## 2020-02-24 ENCOUNTER — Ambulatory Visit (INDEPENDENT_AMBULATORY_CARE_PROVIDER_SITE_OTHER): Payer: BC Managed Care – PPO | Admitting: Family Medicine

## 2020-02-24 VITALS — BP 120/78 | HR 89 | Temp 97.9°F | Ht 62.5 in | Wt 220.3 lb

## 2020-02-24 DIAGNOSIS — E1169 Type 2 diabetes mellitus with other specified complication: Secondary | ICD-10-CM | POA: Diagnosis not present

## 2020-02-24 DIAGNOSIS — M1A9XX0 Chronic gout, unspecified, without tophus (tophi): Secondary | ICD-10-CM | POA: Diagnosis not present

## 2020-02-24 DIAGNOSIS — IMO0002 Reserved for concepts with insufficient information to code with codable children: Secondary | ICD-10-CM

## 2020-02-24 DIAGNOSIS — Z0000 Encounter for general adult medical examination without abnormal findings: Secondary | ICD-10-CM | POA: Diagnosis not present

## 2020-02-24 DIAGNOSIS — E1165 Type 2 diabetes mellitus with hyperglycemia: Secondary | ICD-10-CM

## 2020-02-24 DIAGNOSIS — K7581 Nonalcoholic steatohepatitis (NASH): Secondary | ICD-10-CM

## 2020-02-24 DIAGNOSIS — E1140 Type 2 diabetes mellitus with diabetic neuropathy, unspecified: Secondary | ICD-10-CM

## 2020-02-24 DIAGNOSIS — E782 Mixed hyperlipidemia: Secondary | ICD-10-CM

## 2020-02-24 DIAGNOSIS — K824 Cholesterolosis of gallbladder: Secondary | ICD-10-CM

## 2020-02-24 MED ORDER — GLIMEPIRIDE 4 MG PO TABS
4.0000 mg | ORAL_TABLET | Freq: Every day | ORAL | 3 refills | Status: DC
Start: 2020-02-24 — End: 2021-02-27

## 2020-02-24 MED ORDER — DAPAGLIFLOZIN PROPANEDIOL 5 MG PO TABS
5.0000 mg | ORAL_TABLET | Freq: Every day | ORAL | 3 refills | Status: DC
Start: 2020-02-24 — End: 2020-09-05

## 2020-02-24 MED ORDER — SCOPOLAMINE 1 MG/3DAYS TD PT72: 1.0000 | MEDICATED_PATCH | TRANSDERMAL | 1 refills | Status: DC

## 2020-02-24 MED ORDER — FENOFIBRATE 145 MG PO TABS
145.0000 mg | ORAL_TABLET | Freq: Every day | ORAL | 3 refills | Status: DC
Start: 2020-02-24 — End: 2021-02-27

## 2020-02-24 MED ORDER — ATORVASTATIN CALCIUM 40 MG PO TABS
40.0000 mg | ORAL_TABLET | Freq: Every day | ORAL | 3 refills | Status: DC
Start: 2020-02-24 — End: 2021-02-27

## 2020-02-24 MED ORDER — OZEMPIC (0.25 OR 0.5 MG/DOSE) 2 MG/1.5ML ~~LOC~~ SOPN
0.2500 mg | PEN_INJECTOR | SUBCUTANEOUS | 3 refills | Status: DC
Start: 2020-02-24 — End: 2020-09-05

## 2020-02-24 MED ORDER — PIOGLITAZONE HCL 30 MG PO TABS
30.0000 mg | ORAL_TABLET | Freq: Every day | ORAL | 3 refills | Status: DC
Start: 2020-02-24 — End: 2021-02-27

## 2020-02-24 NOTE — Assessment & Plan Note (Signed)
LFTs stable on actos. ALT slightly elevated.

## 2020-02-24 NOTE — Assessment & Plan Note (Signed)
Chronic, deteriorated - off amaryl for months. States had difficulty fulling from pharmacy, unclear why. rec restart this. I have resubmitted refills of all medications for 1 year.

## 2020-02-24 NOTE — Assessment & Plan Note (Signed)
Consider updating abd [place]

## 2020-02-24 NOTE — Assessment & Plan Note (Signed)
Chronic, stable. Continue fibrate and statin.  The 10-year ASCVD risk score Mikey Bussing DC Brooke Bonito., et al., 2013) is: 3.5%   Values used to calculate the score:     Age: 44 years     Sex: Male     Is Non-Hispanic African American: No     Diabetic: Yes     Tobacco smoker: No     Systolic Blood Pressure: 451 mmHg     Is BP treated: No     HDL Cholesterol: 33.8 mg/dL     Total Cholesterol: 171 mg/dL

## 2020-02-24 NOTE — Patient Instructions (Addendum)
Good to see you today Restart amaryl daily with breakfast.  You are doing well today   Consider pneumonia shot for next visit  Return as needed or in 6 months for diabetes follow up visit.   Health Maintenance, Male Adopting a healthy lifestyle and getting preventive care are important in promoting health and wellness. Ask your health care provider about:  The right schedule for you to have regular tests and exams.  Things you can do on your own to prevent diseases and keep yourself healthy. What should I know about diet, weight, and exercise? Eat a healthy diet   Eat a diet that includes plenty of vegetables, fruits, low-fat dairy products, and lean protein.  Do not eat a lot of foods that are high in solid fats, added sugars, or sodium. Maintain a healthy weight Body mass index (BMI) is a measurement that can be used to identify possible weight problems. It estimates body fat based on height and weight. Your health care provider can help determine your BMI and help you achieve or maintain a healthy weight. Get regular exercise Get regular exercise. This is one of the most important things you can do for your health. Most adults should:  Exercise for at least 150 minutes each week. The exercise should increase your heart rate and make you sweat (moderate-intensity exercise).  Do strengthening exercises at least twice a week. This is in addition to the moderate-intensity exercise.  Spend less time sitting. Even light physical activity can be beneficial. Watch cholesterol and blood lipids Have your blood tested for lipids and cholesterol at 43 years of age, then have this test every 5 years. You may need to have your cholesterol levels checked more often if:  Your lipid or cholesterol levels are high.  You are older than 43 years of age.  You are at high risk for heart disease. What should I know about cancer screening? Many types of cancers can be detected early and may often  be prevented. Depending on your health history and family history, you may need to have cancer screening at various ages. This may include screening for:  Colorectal cancer.  Prostate cancer.  Skin cancer.  Lung cancer. What should I know about heart disease, diabetes, and high blood pressure? Blood pressure and heart disease  High blood pressure causes heart disease and increases the risk of stroke. This is more likely to develop in people who have high blood pressure readings, are of African descent, or are overweight.  Talk with your health care provider about your target blood pressure readings.  Have your blood pressure checked: ? Every 3-5 years if you are 82-55 years of age. ? Every year if you are 26 years old or older.  If you are between the ages of 31 and 70 and are a current or former smoker, ask your health care provider if you should have a one-time screening for abdominal aortic aneurysm (AAA). Diabetes Have regular diabetes screenings. This checks your fasting blood sugar level. Have the screening done:  Once every three years after age 71 if you are at a normal weight and have a low risk for diabetes.  More often and at a younger age if you are overweight or have a high risk for diabetes. What should I know about preventing infection? Hepatitis B If you have a higher risk for hepatitis B, you should be screened for this virus. Talk with your health care provider to find out if you are at  risk for hepatitis B infection. Hepatitis C Blood testing is recommended for:  Everyone born from [birth year] through 1964.  Anyone with known risk factors for hepatitis C. Sexually transmitted infections (STIs)  You should be screened each year for STIs, including gonorrhea and chlamydia, if: ? You are sexually active and are younger than 43 years of age. ? You are older than 43 years of age and your health care provider tells you that you are at risk for this type of  infection. ? Your sexual activity has changed since you were last screened, and you are at increased risk for chlamydia or gonorrhea. Ask your health care provider if you are at risk.  Ask your health care provider about whether you are at high risk for HIV. Your health care provider may recommend a prescription medicine to help prevent HIV infection. If you choose to take medicine to prevent HIV, you should first get tested for HIV. You should then be tested every 3 months for as long as you are taking the medicine. Follow these instructions at home: Lifestyle  Do not use any products that contain nicotine or tobacco, such as cigarettes, e-cigarettes, and chewing tobacco. If you need help quitting, ask your health care provider.  Do not use street drugs.  Do not share needles.  Ask your health care provider for help if you need support or information about quitting drugs. Alcohol use  Do not drink alcohol if your health care provider tells you not to drink.  If you drink alcohol: ? Limit how much you have to 0-2 drinks a day. ? Be aware of how much alcohol is in your drink. In the [place] one drink equals one 12 oz bottle of beer (355 mL), one 5 oz glass of wine (148 mL), or one 1 oz glass of hard liquor (44 mL). General instructions  Schedule regular health, dental, and eye exams.  Stay current with your vaccines.  Tell your health care provider if: ? You often feel depressed. ? You have ever been abused or do not feel safe at home. Summary  Adopting a healthy lifestyle and getting preventive care are important in promoting health and wellness.  Follow your health care provider's instructions about healthy diet, exercising, and getting tested or screened for diseases.  Follow your health care provider's instructions on monitoring your cholesterol and blood pressure. This information is not intended to replace advice given to you by your health care provider. Make sure you  discuss any questions you have with your health care provider. Document Revised: 03/03/2018 Document Reviewed: 03/03/2018 Elsevier Patient Education  2020 [employer].

## 2020-02-24 NOTE — Progress Notes (Signed)
Patient ID: Todd Braun, male    DOB: 24-Dec-1975, 44 y.o.   MRN: [id number]  This visit was conducted in person.  BP 120/78 (BP Location: Left Arm, Patient Position: Sitting, Cuff Size: Large)   Pulse 89   Temp 97.9 F (36.6 C) (Temporal)   Ht 5' 2.5" (1.588 m)   Wt 220 lb 5 oz (99.9 kg)   SpO2 96%   BMI 39.65 kg/m    CC: CPE Subjective:   HPI: Todd Braun is a 44 y.o. male presenting on 02/24/2020 for Annual Exam (Requests rx for motion sickness patch and 90-day rx for [place]. )   Requests motion sickness patch - upcoming cruise in a few months.   Ozempic caused R side abd pain - tolerating 0.59m weekly dose. Doing well on farxiga. Also continues glimepiride 471mwith breakfast, actos 3049maily. Ran out of glimepiride   Preventative:  Flu shotdeclines Pneumonia shot - declines Tdap 2013 COVID vaccine Pzifer 06/2019, 07/2019  Seat belt use discussed Sunscreen use discussed. No changing moles on skin. Non smoker  Alcohol - 1 beer/day Dentist - Q6 mo Eye exam - yearly  Caffeine: 3-4 cups/night coffee  Lives with wife, son (2005) and twins (2012), 1 outside catNeurosurgeonccupation: tooHospital doctor [employer]: HS [employer]: treadmill walk/run 30 min twice weekly Diet: good water, fruits/vegetables occasionally     Relevant past medical, surgical, family and social history reviewed and updated as indicated. Interim medical history since our last visit reviewed. Allergies and medications reviewed and updated. Outpatient Medications Prior to Visit  Medication Sig Dispense Refill  . Blood Glucose Monitoring Suppl (BLOOD GLUCOSE METER KIT AND SUPPLIES) Use as instructed 1 each 0  . glucose blood test strip Check 1-2 times daily and PRN E11.40. LG brand meter. 180 each 3  . Lancets 30G MISC 1 each by Does not apply route daily. 100 each 3  . traMADol (ULTRAM) 50 MG tablet Take 1 tablet (50 mg total) by mouth 2 (two) times daily as needed. 40 tablet 0  . atorvastatin  (LIPITOR) 40 MG tablet TAKE 1 TABLET DAILY 90 tablet 3  . FARXIGA 5 MG TABS tablet TAKE 1 TABLET BY MOUTH EVERY DAY 30 tablet 6  . fenofibrate (TRICOR) 145 MG tablet TAKE 1 TABLET DAILY 90 tablet 3  . pioglitazone (ACTOS) 30 MG tablet TAKE 1 TABLET DAILY 90 tablet 3  . Semaglutide,0.25 or 0.5MG/DOS, (OZEMPIC, 0.25 OR 0.5 MG/DOSE,) 2 MG/1.5ML SOPN Inject 0.25 mg into the skin once a week. 1 pen 6  . glimepiride (AMARYL) 4 MG tablet Take 1 tablet (4 mg total) by mouth daily. (Patient not taking: Reported on 11/07/2019) 90 tablet 3   No facility-administered medications prior to visit.     Per HPI unless specifically indicated in ROS section below Review of Systems  Constitutional: Negative for activity change, appetite change, chills, fatigue, fever and unexpected weight change.  HENT: Negative for hearing loss.   Eyes: Negative for visual disturbance.  Respiratory: Negative for cough, chest tightness, shortness of breath and wheezing.   Cardiovascular: Negative for chest pain, palpitations and leg swelling.  Gastrointestinal: Negative for abdominal distention, abdominal pain, blood in stool, constipation, diarrhea, nausea and vomiting.  Genitourinary: Negative for difficulty urinating and hematuria.  Musculoskeletal: Negative for arthralgias, myalgias and neck pain.  Skin: Negative for rash.  Neurological: Negative for dizziness, seizures, syncope and headaches.  Hematological: Negative for adenopathy. Does not bruise/bleed easily.  Psychiatric/Behavioral: Negative for dysphoric mood.  The patient is not nervous/anxious.    Objective:  BP 120/78 (BP Location: Left Arm, Patient Position: Sitting, Cuff Size: Large)   Pulse 89   Temp 97.9 F (36.6 C) (Temporal)   Ht 5' 2.5" (1.588 m)   Wt 220 lb 5 oz (99.9 kg)   SpO2 96%   BMI 39.65 kg/m   Wt Readings from Last 3 Encounters:  02/24/20 220 lb 5 oz (99.9 kg)  11/07/19 221 lb (100.2 kg)  10/12/19 221 lb (100.2 kg)      Physical  Exam Vitals and nursing note reviewed.  Constitutional:      General: He is not in acute distress.    Appearance: Normal appearance. He is well-developed. He is obese. He is not ill-appearing.  HENT:     Head: Normocephalic and atraumatic.     Right Ear: Hearing, tympanic membrane, ear canal and external ear normal.     Left Ear: Hearing, tympanic membrane, ear canal and external ear normal.  Eyes:     General: No scleral icterus.    Extraocular Movements: Extraocular movements intact.     Conjunctiva/sclera: Conjunctivae normal.     Pupils: Pupils are equal, round, and reactive to light.  Neck:     Thyroid: No thyroid mass or thyromegaly.  Cardiovascular:     Rate and Rhythm: Normal rate and regular rhythm.     Pulses: Normal pulses.          Radial pulses are 2+ on the right side and 2+ on the left side.     Heart sounds: Normal heart sounds. No murmur heard.   Pulmonary:     Effort: Pulmonary effort is normal. No respiratory distress.     Breath sounds: Normal breath sounds. No wheezing, rhonchi or rales.  Abdominal:     General: Abdomen is flat. Bowel sounds are normal. There is no distension.     Palpations: Abdomen is soft. There is no mass.     Tenderness: There is no abdominal tenderness. There is no guarding or rebound.     Hernia: No hernia is present.  Musculoskeletal:        General: Normal range of motion.     Cervical back: Normal range of motion and neck supple.     Right lower leg: No edema.     Left lower leg: No edema.  Lymphadenopathy:     Cervical: No cervical adenopathy.  Skin:    General: Skin is warm and dry.     Findings: No rash.  Neurological:     General: No focal deficit present.     Mental Status: He is alert and oriented to person, place, and time.     Comments: CN grossly intact, station and gait intact  Psychiatric:        Mood and Affect: Mood normal.        Behavior: Behavior normal.        Thought Content: Thought content normal.         Judgment: Judgment normal.       Results for orders placed or performed in visit on 02/21/20  CBC with Differential/Platelet  Result Value Ref Range   WBC 6.3 4.0 - 10.5 K/uL   RBC 5.05 4.22 - 5.81 Mil/uL   Hemoglobin 14.3 13.0 - 17.0 g/dL   HCT 43.3 39 - 52 %   MCV 85.8 78.0 - 100.0 fl   MCHC 33.0 30.0 - 36.0 g/dL   RDW 13.6 11.5 - 15.5 %  Platelets 196.0 150 - 400 K/uL   Neutrophils Relative % 51.9 43 - 77 %   Lymphocytes Relative 38.0 12 - 46 %   Monocytes Relative 7.6 3 - 12 %   Eosinophils Relative 1.8 0 - 5 %   Basophils Relative 0.7 0 - 3 %   Neutro Abs 3.3 1.4 - 7.7 K/uL   Lymphs Abs 2.4 0.7 - 4.0 K/uL   Monocytes Absolute 0.5 0.1 - 1.0 K/uL   Eosinophils Absolute 0.1 0.0 - 0.7 K/uL   Basophils Absolute 0.0 0.0 - 0.1 K/uL  Uric acid  Result Value Ref Range   Uric Acid, Serum 6.6 4.0 - 7.8 mg/dL  Hemoglobin A1c  Result Value Ref Range   Hgb A1c MFr Bld 8.1 (H) 4.6 - 6.5 %  Comprehensive metabolic panel  Result Value Ref Range   Sodium 137 135 - 145 mEq/L   Potassium 3.8 3.5 - 5.1 mEq/L   Chloride 100 96 - 112 mEq/L   CO2 29 19 - 32 mEq/L   Glucose, Bld 154 (H) 70 - 99 mg/dL   BUN 18 6 - 23 mg/dL   Creatinine, Ser 1.05 0.40 - 1.50 mg/dL   Total Bilirubin 0.7 0.2 - 1.2 mg/dL   Alkaline Phosphatase 49 39 - 117 U/L   AST 35 0 - 37 U/L   ALT 55 (H) 0 - 53 U/L   Total Protein 7.8 6.0 - 8.3 g/dL   Albumin 4.5 3.5 - 5.2 g/dL   GFR 87.09 >60.00 mL/min   Calcium 9.9 8.4 - 10.5 mg/dL  Lipid panel  Result Value Ref Range   Cholesterol 171 0 - 200 mg/dL   Triglycerides 220.0 (H) 0 - 149 mg/dL   HDL 33.80 (L) >39.00 mg/dL   VLDL 44.0 (H) 0.0 - 40.0 mg/dL   Total CHOL/HDL Ratio 5    NonHDL 136.89   Microalbumin / creatinine urine ratio  Result Value Ref Range   Microalb, Ur 0.9 0.0 - 1.9 mg/dL   Creatinine,U 107.4 mg/dL   Microalb Creat Ratio 0.9 0.0 - 30.0 mg/g  LDL cholesterol, direct  Result Value Ref Range   Direct LDL 105.0 mg/dL   Assessment & Plan:   This visit occurred during the SARS-CoV-2 public health emergency.  Safety protocols were in place, including screening questions prior to the visit, additional usage of staff PPE, and extensive cleaning of exam room while observing appropriate contact time as indicated for disinfecting solutions.   Scopolamine patch sent to local pharmacy. Reviewed possible side effects.  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 States had difficulty fulling from pharmacy, unclear why. rec restart this. I have resubmitted refills of all medications for 1 year.       Relevant Medications   dapagliflozin propanediol (FARXIGA) 5 MG TABS tablet   glimepiride (AMARYL) 4 MG tablet   pioglitazone (ACTOS) 30 MG tablet   Semaglutide,0.25 or 0.5MG/DOS, (OZEMPIC, 0.25 OR 0.5 MG/DOSE,) 2 MG/1.5ML SOPN   atorvastatin (LIPITOR) 40 MG tablet   Severe obesity (BMI 35.0-39.9) with comorbidity ([place])    Encourage healthy diet and lifestyle choices to affect sustainable weight loss.       Relevant Medications   dapagliflozin propanediol (FARXIGA) 5 MG TABS tablet   glimepiride (AMARYL) 4 MG tablet   pioglitazone (ACTOS) 30 MG tablet   Semaglutide,0.25 or 0.5MG/DOS, (OZEMPIC, 0.25 OR 0.5 MG/DOSE,) 2 MG/1.5ML SOPN  NASH (nonalcoholic steatohepatitis)    LFTs stable on actos. ALT slightly elevated.       Health maintenance examination - Primary    Preventative protocols reviewed and updated unless pt declined. Discussed healthy diet and lifestyle.       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 Continue fibrate and statin.  The 10-year ASCVD risk score Mikey Bussing DC Brooke Bonito., et al., 2013) is: 3.5%    Values used to calculate the score:     Age: 62 years     Sex: Male     Is Non-Hispanic African American: No     Diabetic: Yes     Tobacco smoker: No     Systolic Blood Pressure: 782 mmHg     Is BP treated: No     HDL Cholesterol: 33.8 mg/dL     Total Cholesterol: 171 mg/dL       Relevant Medications   dapagliflozin propanediol (FARXIGA) 5 MG TABS tablet   glimepiride (AMARYL) 4 MG tablet   pioglitazone (ACTOS) 30 MG tablet   Semaglutide,0.25 or 0.5MG/DOS, (OZEMPIC, 0.25 OR 0.5 MG/DOSE,) 2 MG/1.5ML SOPN   atorvastatin (LIPITOR) 40 MG tablet   fenofibrate (TRICOR) 145 MG tablet       Meds ordered this encounter  Medications  . dapagliflozin propanediol (FARXIGA) 5 MG TABS tablet    Sig: Take 1 tablet (5 mg total) by mouth daily.    Dispense:  90 tablet    Refill:  3  . glimepiride (AMARYL) 4 MG tablet    Sig: Take 1 tablet (4 mg total) by mouth daily.    Dispense:  90 tablet    Refill:  3  . pioglitazone (ACTOS) 30 MG tablet    Sig: Take 1 tablet (30 mg total) by mouth daily.    Dispense:  90 tablet    Refill:  3  . Semaglutide,0.25 or 0.5MG/DOS, (OZEMPIC, 0.25 OR 0.5 MG/DOSE,) 2 MG/1.5ML SOPN    Sig: Inject 0.25 mg into the skin once a week.    Dispense:  3 mL    Refill:  3    Note new dose  . atorvastatin (LIPITOR) 40 MG tablet    Sig: Take 1 tablet (40 mg total) by mouth daily.    Dispense:  90 tablet    Refill:  3  . fenofibrate (TRICOR) 145 MG tablet    Sig: Take 1 tablet (145 mg total) by mouth daily.    Dispense:  90 tablet    Refill:  3  . scopolamine (TRANSDERM-SCOP, 1.5 MG,) 1 MG/3DAYS    Sig: Place 1 patch (1.5 mg total) onto the skin every 3 (three) days.    Dispense:  4 patch    Refill:  1   No orders of the defined types were placed in this encounter.   Patient instructions: Good to see you today Restart amaryl daily with breakfast.  You are doing well today   Consider pneumonia shot for next visit  Return as needed or in 6 months for  diabetes follow up visit.   Follow up plan: Return in about 6 months (around 08/24/2020) for follow up visit.  Ria Bush, MD

## 2020-02-24 NOTE — Assessment & Plan Note (Signed)
Encourage healthy diet and lifestyle choices to affect sustainable weight loss.

## 2020-02-24 NOTE — Assessment & Plan Note (Signed)
Preventative protocols reviewed and updated unless pt declined. Discussed healthy diet and lifestyle.  

## 2020-02-24 NOTE — Assessment & Plan Note (Signed)
Urate levels acceptable without recent gout flare

## 2020-05-23 ENCOUNTER — Other Ambulatory Visit: Payer: Self-pay | Admitting: Family Medicine

## 2020-05-23 NOTE — Telephone Encounter (Signed)
Pharmacy requests refill on: Farxiga 5 mg   LAST REFILL: 02/24/2020 (Q-90, R-3) LAST OV: 02/24/2020 NEXT OV: 08/28/2020 PHARMACY: [id number] [place], [place]

## 2020-07-07 DIAGNOSIS — Z20822 Contact with and (suspected) exposure to covid-19: Secondary | ICD-10-CM | POA: Diagnosis not present

## 2020-08-28 ENCOUNTER — Ambulatory Visit: Payer: BC Managed Care – PPO | Admitting: Family Medicine

## 2020-09-05 ENCOUNTER — Ambulatory Visit (INDEPENDENT_AMBULATORY_CARE_PROVIDER_SITE_OTHER): Payer: BC Managed Care – PPO | Admitting: Family Medicine

## 2020-09-05 ENCOUNTER — Encounter: Payer: Self-pay | Admitting: Family Medicine

## 2020-09-05 ENCOUNTER — Other Ambulatory Visit: Payer: Self-pay

## 2020-09-05 VITALS — BP 122/78 | HR 82 | Temp 97.7°F | Ht 62.5 in | Wt 226.1 lb

## 2020-09-05 DIAGNOSIS — E1140 Type 2 diabetes mellitus with diabetic neuropathy, unspecified: Secondary | ICD-10-CM

## 2020-09-05 DIAGNOSIS — E1165 Type 2 diabetes mellitus with hyperglycemia: Secondary | ICD-10-CM | POA: Diagnosis not present

## 2020-09-05 DIAGNOSIS — IMO0002 Reserved for concepts with insufficient information to code with codable children: Secondary | ICD-10-CM

## 2020-09-05 LAB — POCT GLYCOSYLATED HEMOGLOBIN (HGB A1C): Hemoglobin A1C: 7.2 % — AB (ref 4.0–5.6)

## 2020-09-05 MED ORDER — TRULICITY 0.75 MG/0.5ML ~~LOC~~ SOAJ: 0.7500 mg | SUBCUTANEOUS | 11 refills | Status: DC

## 2020-09-05 MED ORDER — DAPAGLIFLOZIN PROPANEDIOL 5 MG PO TABS
5.0000 mg | ORAL_TABLET | Freq: Every day | ORAL | 3 refills | Status: DC
Start: 2020-09-05 — End: 2021-03-11

## 2020-09-05 NOTE — Assessment & Plan Note (Addendum)
Chronic, not checking sugars. Improved control by latest A1c, although hasn't been taking ozempic due to shortage. Will price out trulicity with coupon (to check google for coupon), continue farxiga, amaryl, actos.  Did recommend he schedule eye exam as due. Did recommend pneuococcal vaccine - he declines.  RTC 6 mo DM f/u visit

## 2020-09-05 NOTE — Patient Instructions (Addendum)
Due to ozempic shortage, price out and try tulicity 3.24NH weekly shot. Pens sent to pharmacy. Check online for coupon first.  Continue other medicines, farxiga refilled.  Let me know if any trouble filling medicines.  Schedule eye exam. I do recommend pneumonia shot - let us know when ready for this.

## 2020-09-05 NOTE — Progress Notes (Signed)
Patient ID: Todd Braun, male    DOB: 08/24/1975, 45 y.o.   MRN: [id number]  This visit was conducted in person.  BP 122/78   Pulse 82   Temp 97.7 F (36.5 C) (Temporal)   Ht 5' 2.5" (1.588 m)   Wt 226 lb 1 oz (102.5 kg)   SpO2 96%   BMI 40.69 kg/m    CC: DM f/u visit  Subjective:   HPI: Todd Braun is a 45 y.o. male presenting on 09/05/2020 for Diabetes (Here for 6 mo f/u.)   DM - does not regularly check sugars. Compliant with antihyperglycemic regimen which includes: farxiga 82m daily, amaryl 420mdaily, actos 3057maily, ozempic 0.98m3mekly - difficulty tolerating higher dose. Unable to fill ozempic due to shortage - told by pharmacy. Denies low sugars or hypoglycemic symptoms. Denies paresthesias. Last diabetic eye exam DUE. Pneumovax: due. Prevnar: due. Declines. Glucometer brand: generic. DSME: declined. Lab Results  Component Value Date   HGBA1C 7.2 (A) 09/05/2020   Diabetic Foot Exam - Simple   Simple Foot Form Diabetic Foot exam was performed with the following findings: Yes 09/05/2020  9:56 AM  Visual Inspection See comments: Yes Sensation Testing Intact to touch and monofilament testing bilaterally: Yes Pulse Check Posterior Tibialis and Dorsalis pulse intact bilaterally: Yes Comments Dry skin to bilateral feet/soles    Lab Results  Component Value Date   MICROALBUR 0.9 02/21/2020        Relevant past medical, surgical, family and social history reviewed and updated as indicated. Interim medical history since our last visit reviewed. Allergies and medications reviewed and updated. Outpatient Medications Prior to Visit  Medication Sig Dispense Refill   atorvastatin (LIPITOR) 40 MG tablet Take 1 tablet (40 mg total) by mouth daily. 90 tablet 3   Blood Glucose Monitoring Suppl (BLOOD GLUCOSE METER KIT AND SUPPLIES) Use as instructed 1 each 0   fenofibrate (TRICOR) 145 MG tablet Take 1 tablet (145 mg total) by mouth daily. 90 tablet 3   glimepiride (AMARYL) 4  MG tablet Take 1 tablet (4 mg total) by mouth daily. 90 tablet 3   glucose blood test strip Check 1-2 times daily and PRN E11.40. LG brand meter. 180 each 3   Lancets 30G MISC 1 each by Does not apply route daily. 100 each 3   pioglitazone (ACTOS) 30 MG tablet Take 1 tablet (30 mg total) by mouth daily. 90 tablet 3   scopolamine (TRANSDERM-SCOP, 1.5 MG,) 1 MG/3DAYS Place 1 patch (1.5 mg total) onto the skin every 3 (three) days. 4 patch 1   traMADol (ULTRAM) 50 MG tablet Take 1 tablet (50 mg total) by mouth 2 (two) times daily as needed. 40 tablet 0   dapagliflozin propanediol (FARXIGA) 5 MG TABS tablet Take 1 tablet (5 mg total) by mouth daily. 90 tablet 3   Semaglutide,0.25 or 0.5MG/DOS, (OZEMPIC, 0.25 OR 0.5 MG/DOSE,) 2 MG/1.5ML SOPN Inject 0.25 mg into the skin once a week. (Patient not taking: Reported on 09/05/2020) 3 mL 3   No facility-administered medications prior to visit.     Per HPI unless specifically indicated in ROS section below Review of Systems Objective:  BP 122/78   Pulse 82   Temp 97.7 F (36.5 C) (Temporal)   Ht 5' 2.5" (1.588 m)   Wt 226 lb 1 oz (102.5 kg)   SpO2 96%   BMI 40.69 kg/m   Wt Readings from Last 3 Encounters:  09/05/20 226 lb 1 oz (102.5 kg)  02/24/20 220 lb 5 oz (99.9 kg)  11/07/19 221 lb (100.2 kg)      Physical Exam Vitals and nursing note reviewed.  Constitutional:      Appearance: Normal appearance. He is not ill-appearing.  Eyes:     Extraocular Movements: Extraocular movements intact.     Conjunctiva/sclera: Conjunctivae normal.     Pupils: Pupils are equal, round, and reactive to light.  Cardiovascular:     Rate and Rhythm: Normal rate and regular rhythm.     Pulses: Normal pulses.     Heart sounds: Normal heart sounds. No murmur heard. Pulmonary:     Effort: Pulmonary effort is normal. No respiratory distress.     Breath sounds: Normal breath sounds. No wheezing, rhonchi or rales.  Musculoskeletal:     Right lower leg: No edema.      Left lower leg: No edema.     Comments: See HPI for foot exam if done  Skin:    General: Skin is warm and dry.     Findings: No rash.  Neurological:     Mental Status: He is alert.  Psychiatric:        Mood and Affect: Mood normal.        Behavior: Behavior normal.      Results for orders placed or performed in visit on 09/05/20  POCT glycosylated hemoglobin (Hb A1C)  Result Value Ref Range   Hemoglobin A1C 7.2 (A) 4.0 - 5.6 %   HbA1c POC (<> result, manual entry)     HbA1c, POC (prediabetic range)     HbA1c, POC (controlled diabetic range)     Assessment & Plan:  This visit occurred during the SARS-CoV-2 public health emergency.  Safety protocols were in place, including screening questions prior to the visit, additional usage of staff PPE, and extensive cleaning of exam room while observing appropriate contact time as indicated for disinfecting solutions.   Problem List Items Addressed This Visit     Uncontrolled type 2 diabetes with neuropathy ([place]) - Primary    Chronic, not checking sugars. Improved control by latest A1c, although hasn't been taking ozempic due to shortage. Will price out trulicity with coupon (to check google for coupon), continue farxiga, amaryl, actos.  Did recommend he schedule eye exam as due. Did recommend pneuococcal vaccine - he declines.  RTC 6 mo DM f/u visit        Relevant Medications   dapagliflozin propanediol (FARXIGA) 5 MG TABS tablet   Dulaglutide (TRULICITY) 7.06 CB/7.6EG SOPN   Other Relevant Orders   POCT glycosylated hemoglobin (Hb A1C) (Completed)     Meds ordered this encounter  Medications   dapagliflozin propanediol (FARXIGA) 5 MG TABS tablet    Sig: Take 1 tablet (5 mg total) by mouth daily.    Dispense:  90 tablet    Refill:  3   Dulaglutide (TRULICITY) 3.15 VV/6.1YW SOPN    Sig: Inject 0.75 mg into the skin once a week.    Dispense:  2 mL    Refill:  11    To replace ozempic    Orders Placed This Encounter   Procedures   POCT glycosylated hemoglobin (Hb A1C)    Patient Instructions  Due to ozempic shortage, price out and try tulicity 7.37TG weekly shot. Pens sent to pharmacy. Check online for coupon first.  Continue other medicines, farxiga refilled.  Let me know if any trouble filling medicines.  Schedule eye exam. I do recommend pneumonia shot - let us  know when ready for this.    Follow up plan: Return in about 6 months (around 03/07/2021) for annual exam, prior fasting for blood work.  Ria Bush, MD

## 2020-09-13 ENCOUNTER — Telehealth: Payer: Self-pay

## 2020-09-13 NOTE — Telephone Encounter (Signed)
Received PA request from [facility]eds via [facility] for Farxiga 5 mg tab.  Submitted PA; key:  [id number], Rx #:  [id number].  Decision pending.

## 2020-10-15 LAB — HM DIABETES EYE EXAM

## 2020-10-16 ENCOUNTER — Encounter: Payer: Self-pay | Admitting: Family Medicine

## 2021-02-25 ENCOUNTER — Other Ambulatory Visit: Payer: Self-pay | Admitting: Family Medicine

## 2021-03-03 ENCOUNTER — Other Ambulatory Visit: Payer: Self-pay | Admitting: Family Medicine

## 2021-03-03 DIAGNOSIS — E1140 Type 2 diabetes mellitus with diabetic neuropathy, unspecified: Secondary | ICD-10-CM

## 2021-03-03 DIAGNOSIS — M1A9XX0 Chronic gout, unspecified, without tophus (tophi): Secondary | ICD-10-CM

## 2021-03-03 DIAGNOSIS — E782 Mixed hyperlipidemia: Secondary | ICD-10-CM

## 2021-03-04 ENCOUNTER — Other Ambulatory Visit: Payer: BC Managed Care – PPO

## 2021-03-11 ENCOUNTER — Other Ambulatory Visit: Payer: Self-pay

## 2021-03-11 ENCOUNTER — Encounter: Payer: Self-pay | Admitting: Family Medicine

## 2021-03-11 ENCOUNTER — Ambulatory Visit (INDEPENDENT_AMBULATORY_CARE_PROVIDER_SITE_OTHER): Payer: BC Managed Care – PPO | Admitting: Family Medicine

## 2021-03-11 VITALS — BP 124/70 | HR 94 | Temp 97.3°F | Ht 63.0 in | Wt 225.6 lb

## 2021-03-11 DIAGNOSIS — Z0000 Encounter for general adult medical examination without abnormal findings: Secondary | ICD-10-CM | POA: Diagnosis not present

## 2021-03-11 DIAGNOSIS — M1A9XX0 Chronic gout, unspecified, without tophus (tophi): Secondary | ICD-10-CM

## 2021-03-11 DIAGNOSIS — E1169 Type 2 diabetes mellitus with other specified complication: Secondary | ICD-10-CM | POA: Diagnosis not present

## 2021-03-11 DIAGNOSIS — E782 Mixed hyperlipidemia: Secondary | ICD-10-CM | POA: Diagnosis not present

## 2021-03-11 DIAGNOSIS — E1140 Type 2 diabetes mellitus with diabetic neuropathy, unspecified: Secondary | ICD-10-CM | POA: Diagnosis not present

## 2021-03-11 DIAGNOSIS — K7581 Nonalcoholic steatohepatitis (NASH): Secondary | ICD-10-CM

## 2021-03-11 DIAGNOSIS — K824 Cholesterolosis of gallbladder: Secondary | ICD-10-CM

## 2021-03-11 LAB — COMPREHENSIVE METABOLIC PANEL
ALT: 37 U/L (ref 0–53)
AST: 25 U/L (ref 0–37)
Albumin: 4.4 g/dL (ref 3.5–5.2)
Alkaline Phosphatase: 47 U/L (ref 39–117)
BUN: 16 mg/dL (ref 6–23)
CO2: 26 mEq/L (ref 19–32)
Calcium: 9.7 mg/dL (ref 8.4–10.5)
Chloride: 103 mEq/L (ref 96–112)
Creatinine, Ser: 1.03 mg/dL (ref 0.40–1.50)
GFR: 88.47 mL/min (ref >60.00)
Glucose, Bld: 136 mg/dL — ABNORMAL HIGH (ref 70–99)
Potassium: 3.9 mEq/L (ref 3.5–5.1)
Sodium: 138 mEq/L (ref 135–145)
Total Bilirubin: 0.5 mg/dL (ref 0.2–1.2)
Total Protein: 7.9 g/dL (ref 6.0–8.3)

## 2021-03-11 LAB — MICROALBUMIN / CREATININE URINE RATIO
Creatinine,U: 99.9 mg/dL
Microalb Creat Ratio: 0.7 mg/g (ref 0.0–30.0)
Microalb, Ur: <0.7 mg/dL (ref 0.0–1.9)

## 2021-03-11 LAB — HEMOGLOBIN A1C: Hgb A1c MFr Bld: 8 % — ABNORMAL HIGH (ref 4.6–6.5)

## 2021-03-11 LAB — LIPID PANEL
Cholesterol: 148 mg/dL (ref 0–200)
HDL: 31 mg/dL — ABNORMAL LOW (ref >39.00)
LDL Cholesterol: 88 mg/dL (ref 0–99)
NonHDL: 117.4
Total CHOL/HDL Ratio: 5
Triglycerides: 145 mg/dL (ref 0.0–149.0)
VLDL: 29 mg/dL (ref 0.0–40.0)

## 2021-03-11 LAB — URIC ACID: Uric Acid, Serum: 5.4 mg/dL (ref 4.0–7.8)

## 2021-03-11 MED ORDER — GLIMEPIRIDE 4 MG PO TABS: 4.0000 mg | ORAL_TABLET | Freq: Every day | ORAL | 3 refills | Status: DC

## 2021-03-11 MED ORDER — PIOGLITAZONE HCL 30 MG PO TABS: 30.0000 mg | ORAL_TABLET | Freq: Every day | ORAL | 3 refills | Status: DC

## 2021-03-11 MED ORDER — FENOFIBRATE 145 MG PO TABS: 145.0000 mg | ORAL_TABLET | Freq: Every day | ORAL | 3 refills | Status: DC

## 2021-03-11 MED ORDER — ATORVASTATIN CALCIUM 40 MG PO TABS: 40.0000 mg | ORAL_TABLET | Freq: Every day | ORAL | 3 refills | Status: DC

## 2021-03-11 MED ORDER — DAPAGLIFLOZIN PROPANEDIOL 5 MG PO TABS: 5.0000 mg | ORAL_TABLET | Freq: Every day | ORAL | 3 refills | Status: DC

## 2021-03-11 MED ORDER — TIRZEPATIDE 2.5 MG/0.5ML ~~LOC~~ SOAJ: 2.5000 mg | SUBCUTANEOUS | 0 refills | Status: DC

## 2021-03-11 NOTE — Assessment & Plan Note (Signed)
Update LFTs. Continue actos.

## 2021-03-11 NOTE — Patient Instructions (Addendum)
Labs today  Consider flu and pneumonia shots (prevnar-20).  Ok to price out and try mounjaro - start at 2.63m weekly for 1 month, then call me for next higher dose if going well.  Continue other medicines.  Aim for 7 hours of sleep every night for overall health.  Return as needed or in 6 months for diabetes follow up visit.   Health Maintenance, Male Adopting a healthy lifestyle and getting preventive care are important in promoting health and wellness. Ask your health care provider about: The right schedule for you to have regular tests and exams. Things you can do on your own to prevent diseases and keep yourself healthy. What should I know about diet, weight, and exercise? Eat a healthy diet  Eat a diet that includes plenty of vegetables, fruits, low-fat dairy products, and lean protein. Do not eat a lot of foods that are high in solid fats, added sugars, or sodium. Maintain a healthy weight Body mass index (BMI) is a measurement that can be used to identify possible weight problems. It estimates body fat based on height and weight. Your health care provider can help determine your BMI and help you achieve or maintain a healthy weight. Get regular exercise Get regular exercise. This is one of the most important things you can do for your health. Most adults should: Exercise for at least 150 minutes each week. The exercise should increase your heart rate and make you sweat (moderate-intensity exercise). Do strengthening exercises at least twice a week. This is in addition to the moderate-intensity exercise. Spend less time sitting. Even light physical activity can be beneficial. Watch cholesterol and blood lipids Have your blood tested for lipids and cholesterol at 44years of age, then have this test every 5 years. You may need to have your cholesterol levels checked more often if: Your lipid or cholesterol levels are high. You are older than 44years of age. You are at high risk for  heart disease. What should I know about cancer screening? Many types of cancers can be detected early and may often be prevented. Depending on your health history and family history, you may need to have cancer screening at various ages. This may include screening for: Colorectal cancer. Prostate cancer. Skin cancer. Lung cancer. What should I know about heart disease, diabetes, and high blood pressure? Blood pressure and heart disease High blood pressure causes heart disease and increases the risk of stroke. This is more likely to develop in people who have high blood pressure readings or are overweight. Talk with your health care provider about your target blood pressure readings. Have your blood pressure checked: Every 3-5 years if you are 14349years of age. Every year if you are 446years old or older. If you are between the ages of 662and 759and are a current or former smoker, ask your health care provider if you should have a one-time screening for abdominal aortic aneurysm (AAA). Diabetes Have regular diabetes screenings. This checks your fasting blood sugar level. Have the screening done: Once every three years after age 8064if you are at a normal weight and have a low risk for diabetes. More often and at a younger age if you are overweight or have a high risk for diabetes. What should I know about preventing infection? Hepatitis B If you have a higher risk for hepatitis B, you should be screened for this virus. Talk with your health care provider to find out if you are  at risk for hepatitis B infection. Hepatitis C Blood testing is recommended for: Everyone born from [birth year] through 1964. Anyone with known risk factors for hepatitis C. Sexually transmitted infections (STIs) You should be screened each year for STIs, including gonorrhea and chlamydia, if: You are sexually active and are younger than 44 years of age. You are older than 44 years of age and your health care provider  tells you that you are at risk for this type of infection. Your sexual activity has changed since you were last screened, and you are at increased risk for chlamydia or gonorrhea. Ask your health care provider if you are at risk. Ask your health care provider about whether you are at high risk for HIV. Your health care provider may recommend a prescription medicine to help prevent HIV infection. If you choose to take medicine to prevent HIV, you should first get tested for HIV. You should then be tested every 3 months for as long as you are taking the medicine. Follow these instructions at home: Alcohol use Do not drink alcohol if your health care provider tells you not to drink. If you drink alcohol: Limit how much you have to 0-2 drinks a day. Know how much alcohol is in your drink. In the [place] one drink equals one 12 oz bottle of beer (355 mL), one 5 oz glass of wine (148 mL), or one 1 oz glass of hard liquor (44 mL). Lifestyle Do not use any products that contain nicotine or tobacco. These products include cigarettes, chewing tobacco, and vaping devices, such as e-cigarettes. If you need help quitting, ask your health care provider. Do not use street drugs. Do not share needles. Ask your health care provider for help if you need support or information about quitting drugs. General instructions Schedule regular health, dental, and eye exams. Stay current with your vaccines. Tell your health care provider if: You often feel depressed. You have ever been abused or do not feel safe at home. Summary Adopting a healthy lifestyle and getting preventive care are important in promoting health and wellness. Follow your health care provider's instructions about healthy diet, exercising, and getting tested or screened for diseases. Follow your health care provider's instructions on monitoring your cholesterol and blood pressure. This information is not intended to replace advice given to you by  your health care provider. Make sure you discuss any questions you have with your health care provider. Document Revised: 07/30/2020 Document Reviewed: 07/30/2020 Elsevier Patient Education  [place].

## 2021-03-11 NOTE — Progress Notes (Signed)
Patient ID: Todd Braun, male    DOB: 05/03/1975, 45 y.o.   MRN: [id number]  This visit was conducted in person.  BP 124/70    Pulse 94    Temp (!) 97.3 F (36.3 C) (Temporal)    Ht 5' 3"  (1.6 m)    Wt 225 lb 9 oz (102.3 kg)    SpO2 95%    BMI 39.96 kg/m    CC: CPE Subjective:   HPI: Todd Braun is a 45 y.o. male presenting on 03/11/2021 for Annual Exam   Higher dose Ozempic caused R side abd pain - changed to Trulicity due to drug shortage. Taking 0.45m weekly. Doing well on farxiga 567mdaily, glimepiride 51m33mith breakfast, actos 6m76mily.  Recently ran out of atorva and amaryl - but now back on these meds.   Preventative:  COLONOSCOPY 10/2019 - TA, rpt 7 yrs ([facility]). Notes ongoing bleeding to spicy foods.  No fmhx colon or prostate cancer  Flu shot declined COVID vaccine Pzifer 06/2019, 07/2019, booster 02/2020  Pneumonia shot - declines  Tdap 2013  Seat belt use discussed Sunscreen use discussed. No changing moles on skin.  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 Interim medical history since our last visit reviewed. Allergies and medications reviewed and updated. Outpatient Medications Prior to Visit  Medication Sig Dispense Refill   Blood Glucose Monitoring Suppl (BLOOD GLUCOSE METER KIT AND SUPPLIES) Use as instructed 1 each 0   Dulaglutide (TRULICITY) 0.758.000LK/9.1PHN Inject 0.75 mg into the skin once a week. 2 mL 11   glucose blood test strip Check 1-2 times daily and PRN E11.40. LG brand meter. 180 each 3   Lancets 30G  MISC 1 each by Does not apply route daily. 100 each 3   scopolamine (TRANSDERM-SCOP, 1.5 MG,) 1 MG/3DAYS Place 1 patch (1.5 mg total) onto the skin every 3 (three) days. 4 patch 1   traMADol (ULTRAM) 50 MG tablet Take 1 tablet (50 mg total) by mouth 2 (two) times daily as needed. 40 tablet 0   atorvastatin (LIPITOR) 40 MG tablet TAKE 1 TABLET DAILY 90 tablet 0   dapagliflozin propanediol (FARXIGA) 5 MG TABS tablet Take 1 tablet (5 mg total) by mouth daily. 90 tablet 3   fenofibrate (TRICOR) 145 MG tablet TAKE 1 TABLET DAILY 90 tablet 0   glimepiride (AMARYL) 4 MG tablet TAKE 1 TABLET DAILY 90 tablet 0   pioglitazone (ACTOS) 30 MG tablet TAKE 1 TABLET DAILY 90 tablet 0   No facility-administered medications prior to visit.     Per HPI unless specifically indicated in ROS section below Review of Systems  Constitutional:  Negative for activity change, appetite change, chills, fatigue, fever and unexpected weight change.  HENT:  Negative for hearing loss.   Eyes:  Negative for visual disturbance.  Respiratory:  Positive for shortness of breath. Negative for cough, chest tightness and wheezing.   Cardiovascular:  Negative for chest pain, palpitations and leg swelling.  Gastrointestinal:  Positive for abdominal pain (bilateral sides) and  constipation. Negative for abdominal distention, blood in stool, diarrhea, nausea and vomiting.  Genitourinary:  Negative for difficulty urinating and hematuria.  Musculoskeletal:  Negative for arthralgias, myalgias and neck pain.  Skin:  Negative for rash.  Neurological:  Positive for headaches. Negative for dizziness, seizures and syncope.  Hematological:  Negative for adenopathy. Does not bruise/bleed easily.  Psychiatric/Behavioral:  Negative for dysphoric mood. The patient is not nervous/anxious.    Objective:  BP 124/70    Pulse 94    Temp (!) 97.3 F (36.3 C) (Temporal)    Ht 5' 3"  (1.6 m)    Wt 225 lb 9 oz (102.3 kg)    SpO2 95%    BMI 39.96 kg/m    Wt Readings from Last 3 Encounters:  03/11/21 225 lb 9 oz (102.3 kg)  09/05/20 226 lb 1 oz (102.5 kg)  02/24/20 220 lb 5 oz (99.9 kg)      Physical Exam Vitals and nursing note reviewed.  Constitutional:      General: He is not in acute distress.    Appearance: Normal appearance. He is well-developed. He is not ill-appearing.  HENT:     Head: Normocephalic and atraumatic.     Right Ear: Hearing, tympanic membrane, ear canal and external ear normal.     Left Ear: Hearing, tympanic membrane, ear canal and external ear normal.  Eyes:     General: No scleral icterus.    Extraocular Movements: Extraocular movements intact.     Conjunctiva/sclera: Conjunctivae normal.     Pupils: Pupils are equal, round, and reactive to light.  Neck:     Thyroid: No thyroid mass or thyromegaly.     Vascular: No carotid bruit.  Cardiovascular:     Rate and Rhythm: Normal rate and regular rhythm.     Pulses: Normal pulses.          Radial pulses are 2+ on the right side and 2+ on the left side.     Heart sounds: Normal heart sounds. No murmur heard. Pulmonary:     Effort: Pulmonary effort is normal. No respiratory distress.     Breath sounds: Normal breath sounds. No wheezing, rhonchi or rales.  Abdominal:     General: Bowel sounds are normal. There is no distension.     Palpations: Abdomen is soft. There is no mass.     Tenderness: There is no abdominal tenderness. There is no guarding or rebound.     Hernia: No hernia is present.  Musculoskeletal:        General: Normal range of motion.     Cervical back: Normal range of motion and neck supple.     Right lower leg: No edema.     Left lower leg: No edema.  Lymphadenopathy:     Cervical: No cervical adenopathy.  Skin:    General: Skin is warm and dry.     Findings: No rash.  Neurological:     General: No focal deficit present.     Mental Status: He is alert and oriented to person, place, and time.  Psychiatric:        Mood and Affect: Mood  normal.        Behavior: Behavior normal.        Thought Content: Thought content normal.        Judgment: Judgment normal.      Results for orders placed or performed in visit on 10/16/20  HM DIABETES EYE EXAM  Result Value Ref Range  HM Diabetic Eye Exam No Retinopathy No Retinopathy    Assessment & Plan:  This visit occurred during the SARS-CoV-2 public health emergency.  Safety protocols were in place, including screening questions prior to the visit, additional usage of staff PPE, and extensive cleaning of exam room while observing appropriate contact time as indicated for disinfecting solutions.   Problem List Items Addressed This Visit     Health maintenance examination - Primary (Chronic)    Preventative protocols reviewed and updated unless pt declined. Discussed healthy diet and lifestyle.       Severe obesity (BMI 35.0-39.9) with comorbidity ([place])    Continue to encourage healthy diet and lifestyle choices to affect sustainable weight loss.       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 The 10-year ASCVD risk score (Arnett DK, et al., 2019) is: 11.2%   Values used to calculate the score:     Age: 89ears     Sex: Male     Is Non-Hispanic African American: No     Diabetic: Yes     Tobacco smoker: Yes     Systolic Blood Pressure: 12341mHg     Is BP treated: No     HDL Cholesterol: 33.8 mg/dL     Total Cholesterol: 171 mg/dL       Relevant Medications   tirzepatide (MOUNJARO) 2.5 MG/0.5ML Pen   atorvastatin (LIPITOR) 40 MG tablet   dapagliflozin propanediol  (FARXIGA) 5 MG TABS tablet   fenofibrate (TRICOR) 145 MG tablet   glimepiride (AMARYL) 4 MG tablet   pioglitazone (ACTOS) 30 MG tablet   Type 2 diabetes mellitus with diabetic neuropathy, unspecified (HCC)    Chronic. Ozempic shortage so changed to trulicity. He would like to try mounjaro - sent to pharmacy to price out at 2.43m243mtarting dose for the first month. Update us Koreath effect, if tolerating well, will continue titration schedule. Continue other medications.       Relevant Medications   tirzepatide (MOUNJARO) 2.5 MG/0.5ML Pen   atorvastatin (LIPITOR) 40 MG tablet   dapagliflozin propanediol (FARXIGA) 5 MG TABS tablet   glimepiride (AMARYL) 4 MG tablet   pioglitazone (ACTOS) 30 MG tablet   NASH (nonalcoholic steatohepatitis)    Update LFTs. Continue actos.       Gallbladder polyp    Update LFTs        Meds ordered this encounter  Medications   tirzepatide (MOUNJARO) 2.5 MG/0.5ML Pen    Sig: Inject 2.5 mg into the skin once a week.    Dispense:  2 mL    Refill:  0    To replace trulicity   atorvastatin (LIPITOR) 40 MG tablet    Sig: Take 1 tablet (40 mg total) by mouth daily.    Dispense:  90 tablet    Refill:  3   dapagliflozin propanediol (FARXIGA) 5 MG TABS tablet    Sig: Take 1 tablet (5 mg total) by mouth daily.    Dispense:  90 tablet    Refill:  3   fenofibrate (TRICOR) 145 MG  tablet    Sig: Take 1 tablet (145 mg total) by mouth daily.    Dispense:  90 tablet    Refill:  3   glimepiride (AMARYL) 4 MG tablet    Sig: Take 1 tablet (4 mg total) by mouth daily.    Dispense:  90 tablet    Refill:  3   pioglitazone (ACTOS) 30 MG tablet    Sig: Take 1 tablet (30 mg total) by mouth daily.    Dispense:  90 tablet    Refill:  3   No orders of the defined types were placed in this encounter.    Patient instructions: Labs today  Consider flu and pneumonia shots (prevnar-20).  Ok to price out and try mounjaro - start at 2.21m weekly for 1 month, then call me  for next higher dose if going well.  Continue other medicines.  Aim for 7 hours of sleep every night for overall health.  Return as needed or in 6 months for diabetes follow up visit.   Follow up plan: Return in about 6 months (around 09/09/2021) for follow up visit.  JRia Bush MD

## 2021-03-11 NOTE — Assessment & Plan Note (Signed)
Preventative protocols reviewed and updated unless pt declined. Discussed healthy diet and lifestyle.  

## 2021-03-11 NOTE — Assessment & Plan Note (Addendum)
Chronic. Ozempic shortage so changed to trulicity. He would like to try mounjaro - sent to pharmacy to price out at 2.87m starting dose for the first month. Update uKoreawith effect, if tolerating well, will continue titration schedule. Continue other medications.

## 2021-03-11 NOTE — Assessment & Plan Note (Signed)
Update urate

## 2021-03-11 NOTE — Assessment & Plan Note (Signed)
Update LFT's 

## 2021-03-11 NOTE — Assessment & Plan Note (Addendum)
Update FLP on atorvastatin 91m and fenofibrate 1471mdaily (missed dose this past week). The 10-year ASCVD risk score (Arnett DK, et al., 2019) is: 11.2%   Values used to calculate the score:     Age: [age]ears     Sex: Male     Is Non-Hispanic African American: No     Diabetic: Yes     Tobacco smoker: Yes     Systolic Blood Pressure: 12117mHg     Is BP treated: No     HDL Cholesterol: 33.8 mg/dL     Total Cholesterol: 171 mg/dL

## 2021-03-11 NOTE — Assessment & Plan Note (Signed)
Continue to encourage healthy diet and lifestyle choices to affect sustainable weight loss.

## 2021-04-09 ENCOUNTER — Encounter: Payer: Self-pay | Admitting: Family Medicine

## 2021-04-10 MED ORDER — TIRZEPATIDE 5 MG/0.5ML ~~LOC~~ SOAJ: 5.0000 mg | SUBCUTANEOUS | 0 refills | Status: DC

## 2021-04-19 MED ORDER — OZEMPIC (0.25 OR 0.5 MG/DOSE) 2 MG/1.5ML ~~LOC~~ SOPN: PEN_INJECTOR | SUBCUTANEOUS | 6 refills | Status: DC

## 2021-04-19 NOTE — Addendum Note (Signed)
Addended by: Ria Bush on: 04/19/2021 07:26 AM   Modules accepted: Orders

## 2021-04-23 ENCOUNTER — Telehealth: Payer: Self-pay | Admitting: Family Medicine

## 2021-04-23 MED ORDER — TIRZEPATIDE 5 MG/0.5ML ~~LOC~~ SOAJ: 5.0000 mg | SUBCUTANEOUS | 0 refills | Status: DC

## 2021-04-23 MED ORDER — OZEMPIC (0.25 OR 0.5 MG/DOSE) 2 MG/1.5ML ~~LOC~~ SOPN: PEN_INJECTOR | SUBCUTANEOUS | 6 refills | Status: DC

## 2021-04-23 NOTE — Telephone Encounter (Signed)
I already sent this in

## 2021-04-23 NOTE — Telephone Encounter (Signed)
Per pt's wife, Lattie Haw (on dpr), [facility] in [place], [place] has Ozempic.  She requests pt's rx be sent there.  E-scribed rx as requested.

## 2021-04-23 NOTE — Telephone Encounter (Signed)
Noted  

## 2021-04-23 NOTE — Addendum Note (Signed)
Addended by: Brenton Grills on: 3/55/2174 71:59 AM   Modules accepted: Orders

## 2021-04-23 NOTE — Telephone Encounter (Signed)
Pt wife called stating that [facility] in [place] has medication tirzepatide [facility]) 5 MG/0.5ML Pen. Pt wife would like you to send prescription there.

## 2021-04-23 NOTE — Telephone Encounter (Signed)
Mounjaro refilled.

## 2021-04-23 NOTE — Addendum Note (Signed)
Addended by: Ria Bush on: 04/23/2021 02:19 PM   Modules accepted: Orders

## 2021-05-14 ENCOUNTER — Encounter: Payer: Self-pay | Admitting: Family Medicine

## 2021-05-15 MED ORDER — TIRZEPATIDE 7.5 MG/0.5ML ~~LOC~~ SOAJ: 7.5000 mg | SUBCUTANEOUS | 0 refills | Status: DC

## 2021-05-15 NOTE — Addendum Note (Signed)
Addended by: Ria Bush on: 05/15/2021 07:46 AM   Modules accepted: Orders

## 2021-06-10 MED ORDER — TIRZEPATIDE 10 MG/0.5ML ~~LOC~~ SOAJ: 10.0000 mg | SUBCUTANEOUS | 1 refills | Status: DC

## 2021-06-10 NOTE — Addendum Note (Signed)
Addended by: Ria Bush on: 06/10/2021 08:01 AM ? ? Modules accepted: Orders ? ?

## 2021-08-12 ENCOUNTER — Other Ambulatory Visit: Payer: Self-pay | Admitting: Family Medicine

## 2021-09-06 ENCOUNTER — Other Ambulatory Visit: Payer: Self-pay | Admitting: Family Medicine

## 2021-09-06 NOTE — Telephone Encounter (Signed)
Mounjaro Last filled:  08/13/21, #2 mL Last OV:  03/11/21, CPE Next OV:  10/11/21, f/u

## 2021-09-06 NOTE — Telephone Encounter (Signed)
ERx 

## 2021-09-09 ENCOUNTER — Ambulatory Visit: Payer: BC Managed Care – PPO | Admitting: Family Medicine

## 2021-10-11 ENCOUNTER — Ambulatory Visit (INDEPENDENT_AMBULATORY_CARE_PROVIDER_SITE_OTHER): Payer: BC Managed Care – PPO | Admitting: Family Medicine

## 2021-10-11 ENCOUNTER — Encounter: Payer: Self-pay | Admitting: Family Medicine

## 2021-10-11 VITALS — BP 120/86 | HR 80 | Temp 97.4°F | Ht 63.0 in | Wt 233.0 lb

## 2021-10-11 DIAGNOSIS — R21 Rash and other nonspecific skin eruption: Secondary | ICD-10-CM

## 2021-10-11 DIAGNOSIS — E1140 Type 2 diabetes mellitus with diabetic neuropathy, unspecified: Secondary | ICD-10-CM | POA: Diagnosis not present

## 2021-10-11 LAB — POCT GLYCOSYLATED HEMOGLOBIN (HGB A1C): Hemoglobin A1C: 6.4 % — AB (ref 4.0–5.6)

## 2021-10-11 MED ORDER — TIRZEPATIDE 5 MG/0.5ML ~~LOC~~ SOAJ: 5.0000 mg | SUBCUTANEOUS | 6 refills | Status: DC

## 2021-10-11 NOTE — Progress Notes (Unsigned)
Patient ID: Saahas Hidrogo, male    DOB: Feb 05, 1976, 46 y.o.   MRN: [id number]  This visit was conducted in person.  BP 120/86   Pulse 80   Temp (!) 97.4 F (36.3 C) (Temporal)   Ht 5' 3"  (1.6 m)   Wt 233 lb (105.7 kg)   SpO2 95%   BMI 41.27 kg/m    CC: DM f/u visit  Subjective:   HPI: Burak Zerbe is a 46 y.o. male presenting on 10/11/2021 for Diabetes (Here for f/u.)   DM - does not regularly check sugars overall well controlled. Compliant with antihyperglycemic regimen which includes: farxiga 79m daily, amaryl 411mdaily, actos 3064maily, mounjaro 62m31mekly. He stopped this last month due to R flank pain/R side cramping - that happened every Tuesday after his injection. Also started rash to lower abdomen. Pain largely resolved off Mounjaro, rash persists. Denies low sugars or hypoglycemic symptoms. Denies blurry vision. Occ toe paresthesias. Last diabetic eye exam 09/2020 - appt coming up. Glucometer brand: OTC brand. Last foot exam: 08/2020 - DUE. DSME: declined.  Lab Results  Component Value Date   HGBA1C 6.4 (A) 10/11/2021   Diabetic Foot Exam - Simple   Simple Foot Form Diabetic Foot exam was performed with the following findings: Yes 10/11/2021 11:11 AM  Visual Inspection No deformities, no ulcerations, no other skin breakdown bilaterally: Yes Sensation Testing Intact to touch and monofilament testing bilaterally: Yes Pulse Check Posterior Tibialis and Dorsalis pulse intact bilaterally: Yes Comments    Lab Results  Component Value Date   MICROALBUR <0.7 03/11/2021         Relevant past medical, surgical, family and social history reviewed and updated as indicated. Interim medical history since our last visit reviewed. Allergies and medications reviewed and updated. Outpatient Medications Prior to Visit  Medication Sig Dispense Refill   atorvastatin (LIPITOR) 40 MG tablet Take 1 tablet (40 mg total) by mouth daily. 90 tablet 3   Blood Glucose Monitoring Suppl (BLOOD  GLUCOSE METER KIT AND SUPPLIES) Use as instructed 1 each 0   dapagliflozin propanediol (FARXIGA) 5 MG TABS tablet Take 1 tablet (5 mg total) by mouth daily. 90 tablet 3   fenofibrate (TRICOR) 145 MG tablet Take 1 tablet (145 mg total) by mouth daily. 90 tablet 3   glimepiride (AMARYL) 4 MG tablet Take 1 tablet (4 mg total) by mouth daily. 90 tablet 3   glucose blood test strip Check 1-2 times daily and PRN E11.40. LG brand meter. 180 each 3   Lancets 30G MISC 1 each by Does not apply route daily. 100 each 3   pioglitazone (ACTOS) 30 MG tablet Take 1 tablet (30 mg total) by mouth daily. 90 tablet 3   scopolamine (TRANSDERM-SCOP, 1.5 MG,) 1 MG/3DAYS Place 1 patch (1.5 mg total) onto the skin every 3 (three) days. 4 patch 1   traMADol (ULTRAM) 50 MG tablet Take 1 tablet (50 mg total) by mouth 2 (two) times daily as needed. 40 tablet 0   MOUNJARO 10 MG/0.5ML Pen INJECT 10MG UNDER THE SKIN ONCE A WEEK 2 mL 1   No facility-administered medications prior to visit.     Per HPI unless specifically indicated in ROS section below Review of Systems  Objective:  BP 120/86   Pulse 80   Temp (!) 97.4 F (36.3 C) (Temporal)   Ht 5' 3"  (1.6 m)   Wt 233 lb (105.7 kg)   SpO2 95%   BMI 41.27 kg/m  Wt Readings from Last 3 Encounters:  10/11/21 233 lb (105.7 kg)  03/11/21 225 lb 9 oz (102.3 kg)  09/05/20 226 lb 1 oz (102.5 kg)      Physical Exam Vitals and nursing note reviewed.  Constitutional:      Appearance: Normal appearance. He is not ill-appearing.  Eyes:     Extraocular Movements: Extraocular movements intact.     Conjunctiva/sclera: Conjunctivae normal.     Pupils: Pupils are equal, round, and reactive to light.  Cardiovascular:     Rate and Rhythm: Normal rate and regular rhythm.     Pulses: Normal pulses.     Heart sounds: Normal heart sounds. No murmur heard. Pulmonary:     Effort: Pulmonary effort is normal. No respiratory distress.     Breath sounds: Normal breath sounds. No  wheezing, rhonchi or rales.  Abdominal:     General: Bowel sounds are normal. There is no distension.     Palpations: Abdomen is soft. There is no mass.     Tenderness: There is no abdominal tenderness. There is no right CVA tenderness, left CVA tenderness, guarding or rebound.     Hernia: No hernia is present.  Musculoskeletal:     Right lower leg: No edema.     Left lower leg: No edema.     Comments: See HPI for foot exam if done  Skin:    General: Skin is warm and dry.     Findings: Rash (rough itchy papular rash to RLQ) present.  Neurological:     Mental Status: He is alert.  Psychiatric:        Mood and Affect: Mood normal.        Behavior: Behavior normal.       Results for orders placed or performed in visit on 10/11/21  POCT glycosylated hemoglobin (Hb A1C)  Result Value Ref Range   Hemoglobin A1C 6.4 (A) 4.0 - 5.6 %   HbA1c POC (<> result, manual entry)     HbA1c, POC (prediabetic range)     HbA1c, POC (controlled diabetic range)      Assessment & Plan:   Problem List Items Addressed This Visit     Severe obesity (BMI 35.0-39.9) with comorbidity ([place])    Weight gain noted since he stopped mounjaro - will restart at lower dose 18m weekly.       Relevant Medications   tirzepatide ([facility] 5 MG/0.5ML Pen   Type 2 diabetes mellitus with diabetic neuropathy, unspecified ([place] - Primary    Chronic, great control on farxiga, amaryl, actos. Mounjaro 1741mstopped last month due to R flank/R lower back discomfort that developed along with rash at site of injection. Seemed to tolerate lower dose. Interested in restarting - will send 41m87mounjaro dose, he will let us [place] if any trouble tolerating at lower dose. Story not consistent with pancreatitis.       Relevant Medications   tirzepatide (MOUNJARO) 5 MG/0.5ML Pen   Other Relevant Orders   POCT glycosylated hemoglobin (Hb A1C) (Completed)   Rash    Eczematous rough papular rash at site of Mounjaro injection.   Previously had rash and R abd pain to ozempic 1mg86m/2021).  Possible reaction to this family of medicines - will retrial lower dose and see if rash recurs.         Meds ordered this encounter  Medications   tirzepatide (MOUNJARO) 5 MG/0.5ML Pen    Sig: Inject 5 mg into the skin once a week.  Dispense:  2 mL    Refill:  6    Note new dose - trouble tolerating higher dose   Orders Placed This Encounter  Procedures   POCT glycosylated hemoglobin (Hb A1C)     Patient Instructions  Have eye doctor send me report.  Restart lower mounjaro dose of 13m weekly, let me know if worsening rash or recurrent abdominal pain. Continue other medicines. Rash should continue to improve - if persistent, may try Cortizone-10 over the counter.  Good to see you today Return as needed or in 6 months for physical.   Follow up plan: Return in about 6 months (around 04/13/2022), or if symptoms worsen or fail to improve, for annual exam, prior fasting for blood work.  JRia Bush MD

## 2021-10-11 NOTE — Patient Instructions (Addendum)
Have eye doctor send me report.  Restart lower mounjaro dose of 75m weekly, let me know if worsening rash or recurrent abdominal pain. Continue other medicines. Rash should continue to improve - if persistent, may try Cortizone-10 over the counter.  Good to see you today Return as needed or in 6 months for physical.

## 2021-10-12 DIAGNOSIS — R21 Rash and other nonspecific skin eruption: Secondary | ICD-10-CM | POA: Insufficient documentation

## 2021-10-12 NOTE — Assessment & Plan Note (Signed)
Eczematous rough papular rash at site of Mounjaro injection.  Previously had rash and R abd pain to ozempic 36m (08/2019).  Possible reaction to this family of medicines - will retrial lower dose and see if rash recurs.

## 2021-10-12 NOTE — Assessment & Plan Note (Signed)
Weight gain noted since he stopped mounjaro - will restart at lower dose 36m weekly.

## 2021-10-12 NOTE — Assessment & Plan Note (Addendum)
Chronic, great control on farxiga, amaryl, actos. Mounjaro 32m stopped last month due to R flank/R lower back discomfort that developed along with rash at site of injection. Seemed to tolerate lower dose. Interested in restarting - will send 529mmounjaro dose, he will let [place] if any trouble tolerating at lower dose. Story not consistent with pancreatitis.

## 2021-11-18 LAB — HM DIABETES EYE EXAM

## 2021-12-20 ENCOUNTER — Telehealth: Payer: Self-pay

## 2021-12-20 NOTE — Telephone Encounter (Signed)
Prior auth started and approved for Farxiga 5MG tablets. Vance Peper Key: [id number] - PA Case ID: [id number] - Rx #: [id number]  Approved today Status:Approved;Review Type:Prior Auth;Coverage Start Date:11/20/2021;Coverage End Date:12/20/2022  Patient notified via mychart.

## 2022-03-12 ENCOUNTER — Encounter: Payer: Self-pay | Admitting: Family Medicine

## 2022-03-12 ENCOUNTER — Ambulatory Visit (INDEPENDENT_AMBULATORY_CARE_PROVIDER_SITE_OTHER): Payer: BC Managed Care – PPO | Admitting: Family Medicine

## 2022-03-12 VITALS — BP 120/78 | HR 91 | Temp 97.3°F | Ht 64.5 in | Wt 230.0 lb

## 2022-03-12 DIAGNOSIS — E1140 Type 2 diabetes mellitus with diabetic neuropathy, unspecified: Secondary | ICD-10-CM | POA: Diagnosis not present

## 2022-03-12 DIAGNOSIS — K7581 Nonalcoholic steatohepatitis (NASH): Secondary | ICD-10-CM

## 2022-03-12 DIAGNOSIS — M1A9XX0 Chronic gout, unspecified, without tophus (tophi): Secondary | ICD-10-CM

## 2022-03-12 DIAGNOSIS — E782 Mixed hyperlipidemia: Secondary | ICD-10-CM

## 2022-03-12 DIAGNOSIS — E1169 Type 2 diabetes mellitus with other specified complication: Secondary | ICD-10-CM | POA: Diagnosis not present

## 2022-03-12 DIAGNOSIS — Z0000 Encounter for general adult medical examination without abnormal findings: Secondary | ICD-10-CM

## 2022-03-12 LAB — MICROALBUMIN / CREATININE URINE RATIO
Creatinine,U: 83.2 mg/dL
Microalb Creat Ratio: 0.8 mg/g (ref 0.0–30.0)
Microalb, Ur: <0.7 mg/dL (ref 0.0–1.9)

## 2022-03-12 LAB — URIC ACID: Uric Acid, Serum: 5.2 mg/dL (ref 4.0–7.8)

## 2022-03-12 LAB — COMPREHENSIVE METABOLIC PANEL
ALT: 33 U/L (ref 0–53)
AST: 22 U/L (ref 0–37)
Albumin: 4.7 g/dL (ref 3.5–5.2)
Alkaline Phosphatase: 43 U/L (ref 39–117)
BUN: 14 mg/dL (ref 6–23)
CO2: 27 mEq/L (ref 19–32)
Calcium: 9.4 mg/dL (ref 8.4–10.5)
Chloride: 102 mEq/L (ref 96–112)
Creatinine, Ser: 1.04 mg/dL (ref 0.40–1.50)
GFR: 86.84 mL/min (ref >60.00)
Glucose, Bld: 94 mg/dL (ref 70–99)
Potassium: 4.1 mEq/L (ref 3.5–5.1)
Sodium: 139 mEq/L (ref 135–145)
Total Bilirubin: 0.5 mg/dL (ref 0.2–1.2)
Total Protein: 7.5 g/dL (ref 6.0–8.3)

## 2022-03-12 LAB — LIPID PANEL
Cholesterol: 143 mg/dL (ref 0–200)
HDL: 36.5 mg/dL — ABNORMAL LOW (ref >39.00)
LDL Cholesterol: 84 mg/dL (ref 0–99)
NonHDL: 106.6
Total CHOL/HDL Ratio: 4
Triglycerides: 112 mg/dL (ref 0.0–149.0)
VLDL: 22.4 mg/dL (ref 0.0–40.0)

## 2022-03-12 LAB — HEMOGLOBIN A1C: Hgb A1c MFr Bld: 6.5 % (ref 4.6–6.5)

## 2022-03-12 MED ORDER — GLIMEPIRIDE 4 MG PO TABS: 4.0000 mg | ORAL_TABLET | Freq: Every day | ORAL | 4 refills | Status: DC

## 2022-03-12 MED ORDER — PIOGLITAZONE HCL 30 MG PO TABS: 30.0000 mg | ORAL_TABLET | Freq: Every day | ORAL | 4 refills | Status: DC

## 2022-03-12 MED ORDER — ATORVASTATIN CALCIUM 40 MG PO TABS: 40.0000 mg | ORAL_TABLET | Freq: Every day | ORAL | 4 refills | Status: DC

## 2022-03-12 MED ORDER — TIRZEPATIDE 5 MG/0.5ML ~~LOC~~ SOAJ: 5.0000 mg | SUBCUTANEOUS | 4 refills | Status: DC

## 2022-03-12 MED ORDER — FENOFIBRATE 145 MG PO TABS: 145.0000 mg | ORAL_TABLET | Freq: Every day | ORAL | 4 refills | Status: DC

## 2022-03-12 MED ORDER — DAPAGLIFLOZIN PROPANEDIOL 5 MG PO TABS: 5.0000 mg | ORAL_TABLET | Freq: Every day | ORAL | 4 refills | Status: DC

## 2022-03-12 NOTE — Assessment & Plan Note (Signed)
Update urate off gout medication

## 2022-03-12 NOTE — Assessment & Plan Note (Signed)
Encouraged healthy diet and lifestyle choices to affect sustainable weight loss.  Obesity complicated by comorbidities of dyslipidemia, gout, diabetes

## 2022-03-12 NOTE — Progress Notes (Signed)
Patient ID: Todd Braun, male    DOB: 02/16/1976, 46 y.o.   MRN: [id number]  This visit was conducted in person.  BP 120/78   Pulse 91   Temp (!) 97.3 F (36.3 C) (Temporal)   Ht 5' 4.5" (1.638 m)   Wt 230 lb (104.3 kg)   SpO2 95%   BMI 38.87 kg/m    CC: CPE Subjective:   HPI: Todd Braun is a 46 y.o. male presenting on 03/12/2022 for Annual Exam   DM - continues farxiga, amaryl, actos, mounjaro 68m weekly. Had trouble with higher mounjaro dose, tolerating 562mdose well. Notes some itching to injection site a few days after shot but without rash - doing at thigh which is better tolerated. Sunday injection, then notes lower back stiffness 2-3d later. No epigastric pain.  Lab Results  Component Value Date   HGBA1C 6.4 (A) 10/11/2021     Preventative:  COLONOSCOPY 10/2019 - TA, rpt 7 yrs ([place]). Notes ongoing bleeding to spicy foods.  No fmhx colon or prostate cancer  Flu shot declined COVID vaccine Pzifer 06/2019, 07/2019, booster 02/2020  Pneumonia shot - declines  Tdap 2013, declines rpt  Seat belt use discussed Sunscreen use discussed. No changing moles on skin.  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 Interim medical history since our last visit reviewed. Allergies and medications reviewed and updated. Outpatient Medications Prior to Visit  Medication Sig Dispense Refill   Blood Glucose Monitoring Suppl (BLOOD GLUCOSE METER KIT AND SUPPLIES) Use as instructed 1 each 0   glucose blood test strip Check 1-2 times  daily and PRN E11.40. LG brand meter. 180 each 3   Lancets 30G MISC 1 each by Does not apply route daily. 100 each 3   scopolamine (TRANSDERM-SCOP, 1.5 MG,) 1 MG/3DAYS Place 1 patch (1.5 mg total) onto the skin every 3 (three) days. 4 patch 1   traMADol (ULTRAM) 50 MG tablet Take 1 tablet (50 mg total) by mouth 2 (two) times daily as needed. 40 tablet 0   atorvastatin (LIPITOR) 40 MG tablet Take 1 tablet (40 mg total) by mouth daily. 90 tablet 3   dapagliflozin propanediol (FARXIGA) 5 MG TABS tablet Take 1 tablet (5 mg total) by mouth daily. 90 tablet 3   fenofibrate (TRICOR) 145 MG tablet Take 1 tablet (145 mg total) by mouth daily. 90 tablet 3   glimepiride (AMARYL) 4 MG tablet Take 1 tablet (4 mg total) by mouth daily. 90 tablet 3   pioglitazone (ACTOS) 30 MG tablet Take 1 tablet (30 mg total) by mouth daily. 90 tablet 3   tirzepatide (MOUNJARO) 5 MG/0.5ML Pen Inject 5 mg into the skin once a week. 2 mL 6   No facility-administered medications prior to visit.     Per HPI unless specifically indicated in ROS section below Review of Systems  Constitutional:  Negative for activity change, appetite change, chills, fatigue, fever and unexpected weight change.  HENT:  Negative for hearing loss.   Eyes:  Negative  for visual disturbance.  Respiratory:  Negative for cough, chest tightness, shortness of breath and wheezing.   Cardiovascular:  Negative for chest pain, palpitations and leg swelling.  Gastrointestinal:  Positive for constipation (mild). Negative for abdominal distention, abdominal pain, blood in stool, diarrhea, nausea and vomiting.  Genitourinary:  Negative for difficulty urinating and hematuria.  Musculoskeletal:  Negative for arthralgias, myalgias and neck pain.  Skin:  Negative for rash.  Neurological:  Negative for dizziness, seizures, syncope and headaches.  Hematological:  Negative for adenopathy. Does not bruise/bleed easily.  Psychiatric/Behavioral:  Negative for dysphoric  mood. The patient is not nervous/anxious.     Objective:  BP 120/78   Pulse 91   Temp (!) 97.3 F (36.3 C) (Temporal)   Ht 5' 4.5" (1.638 m)   Wt 230 lb (104.3 kg)   SpO2 95%   BMI 38.87 kg/m   Wt Readings from Last 3 Encounters:  03/12/22 230 lb (104.3 kg)  10/11/21 233 lb (105.7 kg)  03/11/21 225 lb 9 oz (102.3 kg)      Physical Exam Vitals and nursing note reviewed.  Constitutional:      General: He is not in acute distress.    Appearance: Normal appearance. He is well-developed. He is not ill-appearing.  HENT:     Head: Normocephalic and atraumatic.     Right Ear: Hearing, tympanic membrane, ear canal and external ear normal.     Left Ear: Hearing, tympanic membrane, ear canal and external ear normal.     Mouth/Throat:     Comments: Wearing mask Eyes:     General: No scleral icterus.    Extraocular Movements: Extraocular movements intact.     Conjunctiva/sclera: Conjunctivae normal.     Pupils: Pupils are equal, round, and reactive to light.  Neck:     Thyroid: No thyroid mass or thyromegaly.  Cardiovascular:     Rate and Rhythm: Normal rate and regular rhythm.     Pulses: Normal pulses.          Radial pulses are 2+ on the right side and 2+ on the left side.     Heart sounds: Normal heart sounds. No murmur heard. Pulmonary:     Effort: Pulmonary effort is normal. No respiratory distress.     Breath sounds: Normal breath sounds. No wheezing, rhonchi or rales.  Abdominal:     General: Bowel sounds are normal. There is no distension.     Palpations: Abdomen is soft. There is no mass.     Tenderness: There is no abdominal tenderness. There is no guarding or rebound.     Hernia: No hernia is present.  Musculoskeletal:        General: Normal range of motion.     Cervical back: Normal range of motion and neck supple.     Right lower leg: No edema.     Left lower leg: No edema.  Lymphadenopathy:     Cervical: No cervical adenopathy.  Skin:    General: Skin is  warm and dry.     Findings: No rash.  Neurological:     General: No focal deficit present.     Mental Status: He is alert and oriented to person, place, and time.  Psychiatric:        Mood and Affect: Mood normal.        Behavior: Behavior normal.        Thought Content: Thought content normal.        Judgment: Judgment normal.  Results for orders placed or performed in visit on 11/18/21  HM DIABETES EYE EXAM  Result Value Ref Range   HM Diabetic Eye Exam No Retinopathy No Retinopathy    Assessment & Plan:   Problem List Items Addressed This Visit     Health maintenance examination - Primary (Chronic)    Preventative protocols reviewed and updated unless pt declined. Discussed healthy diet and lifestyle.       Severe obesity (BMI 35.0-39.9) with comorbidity ([place])    Encouraged healthy diet and lifestyle choices to affect sustainable weight loss.  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 The 10-year ASCVD risk score (Arnett DK, et al., 2019) is: 10%   Values used to calculate the score:     Age: 48 years     Sex: Male     Is Non-Hispanic African American: No     Diabetic: Yes     Tobacco smoker: Yes     Systolic Blood Pressure: 510 mmHg     Is BP treated: No     HDL Cholesterol: 31 mg/dL     Total Cholesterol: 148 mg/dL       Relevant Medications   atorvastatin (LIPITOR) 40 MG tablet   dapagliflozin propanediol (FARXIGA) 5 MG TABS tablet   fenofibrate (TRICOR) 145 MG tablet    glimepiride (AMARYL) 4 MG tablet   pioglitazone (ACTOS) 30 MG tablet   tirzepatide (MOUNJARO) 5 MG/0.5ML Pen   Other Relevant Orders   Lipid panel   Comprehensive metabolic panel   Type 2 diabetes mellitus with diabetic neuropathy, unspecified ([facility])    Chronic, anticipate remains in good control on current regimen - update A1c. He did not tolerate higher mounjaro dose but is overall doing well on 52m weekly - will continue at this dose.       Relevant Medications   atorvastatin (LIPITOR) 40 MG tablet   dapagliflozin propanediol (FARXIGA) 5 MG TABS tablet   glimepiride (AMARYL) 4 MG tablet   pioglitazone (ACTOS) 30 MG tablet   tirzepatide (MOUNJARO) 5 MG/0.5ML Pen   Other Relevant Orders   Hemoglobin A1c   Microalbumin / creatinine urine ratio   NASH (nonalcoholic steatohepatitis)    Update LFTs, continue mounjaro and actos.         Meds ordered this encounter  Medications   atorvastatin (LIPITOR) 40 MG tablet    Sig: Take 1 tablet (40 mg total) by mouth daily.    Dispense:  90 tablet    Refill:  4   dapagliflozin propanediol (FARXIGA) 5 MG TABS tablet    Sig: Take 1 tablet (5 mg total) by mouth daily.    Dispense:  90 tablet    Refill:  4   fenofibrate (TRICOR) 145 MG tablet    Sig: Take 1 tablet (145 mg total) by mouth daily.    Dispense:  90 tablet    Refill:  4   glimepiride (AMARYL) 4 MG tablet    Sig: Take 1 tablet (  4 mg total) by mouth daily.    Dispense:  90 tablet    Refill:  4   pioglitazone (ACTOS) 30 MG tablet    Sig: Take 1 tablet (30 mg total) by mouth daily.    Dispense:  90 tablet    Refill:  4   tirzepatide (MOUNJARO) 5 MG/0.5ML Pen    Sig: Inject 5 mg into the skin once a week.    Dispense:  6 mL    Refill:  4   Orders Placed This Encounter  Procedures   Lipid panel   Comprehensive metabolic panel   Hemoglobin A1c   Microalbumin / creatinine urine ratio   Uric acid    Patient instructions: Labs today  Look into [id number] - new one time  pneumonia shot.  Consider tetanus shot (Tdap).  Return in 6 months for diabetes follow up visit.   Follow up plan: Return in about 6 months (around 09/11/2022) for follow up visit.  Ria Bush, MD

## 2022-03-12 NOTE — Patient Instructions (Addendum)
Labs today  Look into Prevnar-20 - new one time pneumonia shot.  Consider tetanus shot (Tdap).  Return in 6 months for diabetes follow up visit.   Health Maintenance, Male Adopting a healthy lifestyle and getting preventive care are important in promoting health and wellness. Ask your health care provider about: The right schedule for you to have regular tests and exams. Things you can do on your own to prevent diseases and keep yourself healthy. What should I know about diet, weight, and exercise? Eat a healthy diet  Eat a diet that includes plenty of vegetables, fruits, low-fat dairy products, and lean protein. Do not eat a lot of foods that are high in solid fats, added sugars, or sodium. Maintain a healthy weight Body mass index (BMI) is a measurement that can be used to identify possible weight problems. It estimates body fat based on height and weight. Your health care provider can help determine your BMI and help you achieve or maintain a healthy weight. Get regular exercise Get regular exercise. This is one of the most important things you can do for your health. Most adults should: Exercise for at least 150 minutes each week. The exercise should increase your heart rate and make you sweat (moderate-intensity exercise). Do strengthening exercises at least twice a week. This is in addition to the moderate-intensity exercise. Spend less time sitting. Even light physical activity can be beneficial. Watch cholesterol and blood lipids Have your blood tested for lipids and cholesterol at 45 years of age, then have this test every 5 years. You may need to have your cholesterol levels checked more often if: Your lipid or cholesterol levels are high. You are older than 45 years of age. You are at high risk for heart disease. What should I know about cancer screening? Many types of cancers can be detected early and may often be prevented. Depending on your health history and family history,  you may need to have cancer screening at various ages. This may include screening for: Colorectal cancer. Prostate cancer. Skin cancer. Lung cancer. What should I know about heart disease, diabetes, and high blood pressure? Blood pressure and heart disease High blood pressure causes heart disease and increases the risk of stroke. This is more likely to develop in people who have high blood pressure readings or are overweight. Talk with your health care provider about your target blood pressure readings. Have your blood pressure checked: Every 3-5 years if you are 3-9 years of age. Every year if you are 14 years old or older. If you are between the ages of 37 and 48 and are a current or former smoker, ask your health care provider if you should have a one-time screening for abdominal aortic aneurysm (AAA). Diabetes Have regular diabetes screenings. This checks your fasting blood sugar level. Have the screening done: Once every three years after age 22 if you are at a normal weight and have a low risk for diabetes. More often and at a younger age if you are overweight or have a high risk for diabetes. What should I know about preventing infection? Hepatitis B If you have a higher risk for hepatitis B, you should be screened for this virus. Talk with your health care provider to find out if you are at risk for hepatitis B infection. Hepatitis C Blood testing is recommended for: Everyone born from [birth year] through 1964. Anyone with known risk factors for hepatitis C. Sexually transmitted infections (STIs) You should be screened each year  for STIs, including gonorrhea and chlamydia, if: You are sexually active and are younger than 45 years of age. You are older than 45 years of age and your health care provider tells you that you are at risk for this type of infection. Your sexual activity has changed since you were last screened, and you are at increased risk for chlamydia or gonorrhea. Ask  your health care provider if you are at risk. Ask your health care provider about whether you are at high risk for HIV. Your health care provider may recommend a prescription medicine to help prevent HIV infection. If you choose to take medicine to prevent HIV, you should first get tested for HIV. You should then be tested every 3 months for as long as you are taking the medicine. Follow these instructions at home: Alcohol use Do not drink alcohol if your health care provider tells you not to drink. If you drink alcohol: Limit how much you have to 0-2 drinks a day. Know how much alcohol is in your drink. In the [place] one drink equals one 12 oz bottle of beer (355 mL), one 5 oz glass of wine (148 mL), or one 1 oz glass of hard liquor (44 mL). Lifestyle Do not use any products that contain nicotine or tobacco. These products include cigarettes, chewing tobacco, and vaping devices, such as e-cigarettes. If you need help quitting, ask your health care provider. Do not use street drugs. Do not share needles. Ask your health care provider for help if you need support or information about quitting drugs. General instructions Schedule regular health, dental, and eye exams. Stay current with your vaccines. Tell your health care provider if: You often feel depressed. You have ever been abused or do not feel safe at home. Summary Adopting a healthy lifestyle and getting preventive care are important in promoting health and wellness. Follow your health care provider's instructions about healthy diet, exercising, and getting tested or screened for diseases. Follow your health care provider's instructions on monitoring your cholesterol and blood pressure. This information is not intended to replace advice given to you by your health care provider. Make sure you discuss any questions you have with your health care provider. Document Revised: 07/30/2020 Document Reviewed: 07/30/2020 Elsevier Patient  Education  [place].

## 2022-03-12 NOTE — Assessment & Plan Note (Signed)
Update LFTs, continue mounjaro and actos.

## 2022-03-12 NOTE — Assessment & Plan Note (Signed)
Chronic, anticipate remains in good control on current regimen - update A1c. He did not tolerate higher mounjaro dose but is overall doing well on 37m weekly - will continue at this dose.

## 2022-03-12 NOTE — Assessment & Plan Note (Signed)
Preventative protocols reviewed and updated unless pt declined. Discussed healthy diet and lifestyle.  

## 2022-03-12 NOTE — Assessment & Plan Note (Addendum)
Update FLP on atorvastatin and fenofibrate. The 10-year ASCVD risk score (Arnett DK, et al., 2019) is: 10%   Values used to calculate the score:     Age: 46 years     Sex: Male     Is Non-Hispanic African American: No     Diabetic: Yes     Tobacco smoker: Yes     Systolic Blood Pressure: 604 mmHg     Is BP treated: No     HDL Cholesterol: 31 mg/dL     Total Cholesterol: 148 mg/dL

## 2022-03-20 ENCOUNTER — Other Ambulatory Visit: Payer: Self-pay | Admitting: Family Medicine

## 2022-03-20 NOTE — Telephone Encounter (Signed)
Too soon. Rx sent on 03/12/22, #90/4 to [facility] mail order pharmacy.

## 2022-06-26 ENCOUNTER — Encounter: Payer: Self-pay | Admitting: Family Medicine

## 2022-06-26 ENCOUNTER — Other Ambulatory Visit: Payer: Self-pay | Admitting: Family Medicine

## 2022-06-26 MED ORDER — DAPAGLIFLOZIN PROPANEDIOL 5 MG PO TABS: 5.0000 mg | ORAL_TABLET | Freq: Every day | ORAL | 11 refills | Status: DC

## 2022-09-02 ENCOUNTER — Encounter: Payer: Self-pay | Admitting: Family Medicine

## 2022-09-02 ENCOUNTER — Ambulatory Visit (INDEPENDENT_AMBULATORY_CARE_PROVIDER_SITE_OTHER): Payer: BC Managed Care – PPO | Admitting: Family Medicine

## 2022-09-02 VITALS — BP 120/76 | HR 84 | Temp 97.5°F | Ht 64.5 in | Wt 232.2 lb

## 2022-09-02 DIAGNOSIS — E1140 Type 2 diabetes mellitus with diabetic neuropathy, unspecified: Secondary | ICD-10-CM

## 2022-09-02 LAB — POCT GLYCOSYLATED HEMOGLOBIN (HGB A1C): Hemoglobin A1C: 6.4 % — AB (ref 4.0–5.6)

## 2022-09-02 NOTE — Progress Notes (Signed)
Ph: [phone] Fax: [phone]   Patient ID: Todd Braun, male    DOB: [birth year], 47 y.o.   MRN: [id number]  This visit was conducted in person.  BP 120/76   Pulse 84   Temp (!) 97.5 F (36.4 C) (Temporal)   Ht 5' 4.5" (1.638 m)   Wt 232 lb 4 oz (105.3 kg)   SpO2 94%   BMI 39.25 kg/m    CC: 6 mo f/u visit  Subjective:   HPI: Todd Braun is a 47 y.o. male presenting on 09/02/2022 for Medical Management of Chronic Issues (Here for 6 mo DM f/u.)   DM - does not regularly check sugars about once a month, last month "good". Compliant with antihyperglycemic regimen which includes: farxiga 5mg  daily, amaryl 4mg  daily, actos 30mg  daily, mounjaro 5mg  weekly (Sundays) into thigh. Tolerating mounjaro without nausea, diarrhea, epigastric pain, but notes some constipation. Tolerating farxiga without UTI or yeast infection. Denies hypoglycemic symptoms. Denies paresthesias, blurry vision. Last diabetic eye exam 10/2021. Glucometer brand: unsure. Last foot exam: 09/2021 - rpt today. DSME: declined. Lab Results  Component Value Date   HGBA1C 6.4 (A) 09/02/2022   Diabetic Foot Exam - Simple   Simple Foot Form Diabetic Foot exam was performed with the following findings: Yes 09/02/2022  9:39 AM  Visual Inspection No deformities, no ulcerations, no other skin breakdown bilaterally: Yes Sensation Testing Intact to touch and monofilament testing bilaterally: Yes Pulse Check Posterior Tibialis and Dorsalis pulse intact bilaterally: Yes Comments No claudication Mole to left dorsal foot    Lab Results  Component Value Date   MICROALBUR <0.7 03/12/2022       Relevant past medical, surgical, family and social history reviewed and updated as indicated. Interim medical history since our last visit reviewed. Allergies and medications reviewed and updated. Outpatient Medications Prior to Visit  Medication Sig Dispense Refill   atorvastatin (LIPITOR) 40 MG tablet Take 1 tablet (40 mg total) by  mouth daily. 90 tablet 4   Blood Glucose Monitoring Suppl (BLOOD GLUCOSE METER KIT AND SUPPLIES) Use as instructed 1 each 0   dapagliflozin propanediol (FARXIGA) 5 MG TABS tablet Take 1 tablet (5 mg total) by mouth daily. 30 tablet 11   fenofibrate (TRICOR) 145 MG tablet Take 1 tablet (145 mg total) by mouth daily. 90 tablet 4   glimepiride (AMARYL) 4 MG tablet Take 1 tablet (4 mg total) by mouth daily. 90 tablet 4   glucose blood test strip Check 1-2 times daily and PRN E11.40. LG brand meter. 180 each 3   Lancets 30G MISC 1 each by Does not apply route daily. 100 each 3   pioglitazone (ACTOS) 30 MG tablet Take 1 tablet (30 mg total) by mouth daily. 90 tablet 4   scopolamine (TRANSDERM-SCOP, 1.5 MG,) 1 MG/3DAYS Place 1 patch (1.5 mg total) onto the skin every 3 (three) days. 4 patch 1   tirzepatide (MOUNJARO) 5 MG/0.5ML Pen Inject 5 mg into the skin once a week. 6 mL 4   traMADol (ULTRAM) 50 MG tablet Take 1 tablet (50 mg total) by mouth 2 (two) times daily as needed. 40 tablet 0   No facility-administered medications prior to visit.     Per HPI unless specifically indicated in ROS section below Review of Systems  Objective:  BP 120/76   Pulse 84   Temp (!) 97.5 F (36.4 C) (Temporal)   Ht 5' 4.5" (1.638 m)   Wt 232 lb 4 oz (105.3 kg)  SpO2 94%   BMI 39.25 kg/m   Wt Readings from Last 3 Encounters:  09/02/22 232 lb 4 oz (105.3 kg)  03/12/22 230 lb (104.3 kg)  10/11/21 233 lb (105.7 kg)      Physical Exam Vitals and nursing note reviewed.  Constitutional:      Appearance: Normal appearance. He is not ill-appearing.  Eyes:     Extraocular Movements: Extraocular movements intact.     Conjunctiva/sclera: Conjunctivae normal.     Pupils: Pupils are equal, round, and reactive to light.  Neck:     Thyroid: No thyroid mass or thyromegaly.  Cardiovascular:     Rate and Rhythm: Normal rate and regular rhythm.     Pulses: Normal pulses.     Heart sounds: Normal heart sounds. No  murmur heard. Pulmonary:     Effort: Pulmonary effort is normal. No respiratory distress.     Breath sounds: Normal breath sounds. No wheezing, rhonchi or rales.  Abdominal:     General: Bowel sounds are normal. There is no distension.     Palpations: Abdomen is soft. There is no mass.     Tenderness: There is no abdominal tenderness. There is no guarding or rebound.  Musculoskeletal:     Right lower leg: No edema.     Left lower leg: No edema.     Comments: See HPI for foot exam if done  Skin:    General: Skin is warm and dry.     Findings: No rash.  Neurological:     Mental Status: He is alert.  Psychiatric:        Mood and Affect: Mood normal.        Behavior: Behavior normal.       Results for orders placed or performed in visit on 09/02/22  POCT glycosylated hemoglobin (Hb A1C)  Result Value Ref Range   Hemoglobin A1C 6.4 (A) 4.0 - 5.6 %   HbA1c POC (<> result, manual entry)     HbA1c, POC (prediabetic range)     HbA1c, POC (controlled diabetic range)      Assessment & Plan:   Problem List Items Addressed This Visit     Type 2 diabetes mellitus with diabetic neuropathy, unspecified (HCC) - Primary    Chronic, stable on current regimen - continue. Notes ongoing bloating and constipation on 5mg  Mounjaro weekly - with previous difficulty tolerating higher dose. He declines dropping dose to 2.5mg  weekly. Discussed trying Q10d dosing to see if better tolerated.  Discussed importance of taking sulfonylurea with meals.       Relevant Orders   POCT glycosylated hemoglobin (Hb A1C) (Completed)     No orders of the defined types were placed in this encounter.   Orders Placed This Encounter  Procedures   POCT glycosylated hemoglobin (Hb A1C)    Patient Instructions  You are doing well today Continue current medicines. Consider dropping mounjaro dosing to every 10 days, let me know if tolerated better.  Take Amaryl with food. If you're going to miss lunch, hold amary  for that day.  You will be due for repeat eye exam in August.  Return as needed or in 6 months for physical (after 03/13/2023).   Follow up plan: Return in about 6 months (around 03/04/2023) for annual exam, prior fasting for blood work.  Eustaquio Boyden, MD

## 2022-09-02 NOTE — Assessment & Plan Note (Addendum)
Chronic, stable on current regimen - continue. Notes ongoing bloating and constipation on 5mg  Mounjaro weekly - with previous difficulty tolerating higher dose. He declines dropping dose to 2.5mg  weekly. Discussed trying Q10d dosing to see if better tolerated.  Discussed importance of taking sulfonylurea with meals.

## 2022-09-02 NOTE — Patient Instructions (Addendum)
You are doing well today Continue current medicines. Consider dropping mounjaro dosing to every 10 days, let me know if tolerated better.  Take Amaryl with food. If you're going to miss lunch, hold amary for that day.  You will be due for repeat eye exam in August.  Return as needed or in 6 months for physical (after 03/13/2023).

## 2022-09-12 ENCOUNTER — Ambulatory Visit: Payer: BC Managed Care – PPO | Admitting: Family Medicine

## 2022-09-23 ENCOUNTER — Encounter: Payer: Self-pay | Admitting: Family Medicine

## 2022-09-26 MED ORDER — DAPAGLIFLOZIN PROPANEDIOL 5 MG PO TABS: 5.0000 mg | ORAL_TABLET | Freq: Every day | ORAL | 3 refills | Status: DC

## 2022-10-07 MED ORDER — DAPAGLIFLOZIN PROPANEDIOL 10 MG PO TABS: 10.0000 mg | ORAL_TABLET | Freq: Every day | ORAL | 1 refills | Status: DC

## 2022-10-07 NOTE — Addendum Note (Signed)
Addended by: Eustaquio Boyden on: 10/07/2022 05:11 PM   Modules accepted: Orders

## 2022-10-07 NOTE — Telephone Encounter (Addendum)
Farxiga 10mg  dose sent to pharmacy. Looks like this may need PA - can we forward to PA team?  Metformin intolerance Mounjaro unable to tolerate higher dose.

## 2022-10-08 ENCOUNTER — Other Ambulatory Visit (HOSPITAL_COMMUNITY): Payer: Self-pay

## 2022-10-08 NOTE — Telephone Encounter (Signed)
 Noted  

## 2022-11-20 LAB — HM DIABETES EYE EXAM

## 2022-12-01 ENCOUNTER — Encounter: Payer: Self-pay | Admitting: Family Medicine

## 2022-12-01 MED ORDER — TIRZEPATIDE 5 MG/0.5ML ~~LOC~~ SOAJ: 5.0000 mg | SUBCUTANEOUS | 3 refills | Status: DC

## 2023-03-04 ENCOUNTER — Ambulatory Visit (INDEPENDENT_AMBULATORY_CARE_PROVIDER_SITE_OTHER): Payer: BC Managed Care – PPO | Admitting: Family Medicine

## 2023-03-04 ENCOUNTER — Encounter: Payer: Self-pay | Admitting: Family Medicine

## 2023-03-04 VITALS — BP 120/76 | HR 83 | Temp 98.0°F | Ht 62.75 in | Wt 233.2 lb

## 2023-03-04 DIAGNOSIS — Z7984 Long term (current) use of oral hypoglycemic drugs: Secondary | ICD-10-CM

## 2023-03-04 DIAGNOSIS — Z0001 Encounter for general adult medical examination with abnormal findings: Secondary | ICD-10-CM | POA: Diagnosis not present

## 2023-03-04 DIAGNOSIS — M1A9XX0 Chronic gout, unspecified, without tophus (tophi): Secondary | ICD-10-CM | POA: Diagnosis not present

## 2023-03-04 DIAGNOSIS — R109 Unspecified abdominal pain: Secondary | ICD-10-CM | POA: Diagnosis not present

## 2023-03-04 DIAGNOSIS — K5903 Drug induced constipation: Secondary | ICD-10-CM

## 2023-03-04 DIAGNOSIS — N529 Male erectile dysfunction, unspecified: Secondary | ICD-10-CM | POA: Insufficient documentation

## 2023-03-04 DIAGNOSIS — E782 Mixed hyperlipidemia: Secondary | ICD-10-CM

## 2023-03-04 DIAGNOSIS — K7581 Nonalcoholic steatohepatitis (NASH): Secondary | ICD-10-CM | POA: Diagnosis not present

## 2023-03-04 DIAGNOSIS — K5900 Constipation, unspecified: Secondary | ICD-10-CM | POA: Insufficient documentation

## 2023-03-04 DIAGNOSIS — Z0000 Encounter for general adult medical examination without abnormal findings: Secondary | ICD-10-CM

## 2023-03-04 DIAGNOSIS — Z7985 Long-term (current) use of injectable non-insulin antidiabetic drugs: Secondary | ICD-10-CM

## 2023-03-04 DIAGNOSIS — E1169 Type 2 diabetes mellitus with other specified complication: Secondary | ICD-10-CM | POA: Diagnosis not present

## 2023-03-04 DIAGNOSIS — E1140 Type 2 diabetes mellitus with diabetic neuropathy, unspecified: Secondary | ICD-10-CM

## 2023-03-04 LAB — CBC WITH DIFFERENTIAL/PLATELET
Basophils Absolute: 0 10*3/uL (ref 0.0–0.1)
Basophils Relative: 0.4 % (ref 0.0–3.0)
Eosinophils Absolute: 0.2 10*3/uL (ref 0.0–0.7)
Eosinophils Relative: 2.7 % (ref 0.0–5.0)
HCT: 44.3 % (ref 39.0–52.0)
Hemoglobin: 14.3 g/dL (ref 13.0–17.0)
Lymphocytes Relative: 36.3 % (ref 12.0–46.0)
Lymphs Abs: 2.5 10*3/uL (ref 0.7–4.0)
MCHC: 32.3 g/dL (ref 30.0–36.0)
MCV: 88.2 fL (ref 78.0–100.0)
Monocytes Absolute: 0.6 10*3/uL (ref 0.1–1.0)
Monocytes Relative: 8.5 % (ref 3.0–12.0)
Neutro Abs: 3.6 10*3/uL (ref 1.4–7.7)
Neutrophils Relative %: 52.1 % (ref 43.0–77.0)
Platelets: 199 10*3/uL (ref 150.0–400.0)
RBC: 5.02 Mil/uL (ref 4.22–5.81)
RDW: 14.3 % (ref 11.5–15.5)
WBC: 6.9 10*3/uL (ref 4.0–10.5)

## 2023-03-04 LAB — COMPREHENSIVE METABOLIC PANEL
ALT: 31 U/L (ref 0–53)
AST: 21 U/L (ref 0–37)
Albumin: 4.4 g/dL (ref 3.5–5.2)
Alkaline Phosphatase: 46 U/L (ref 39–117)
BUN: 16 mg/dL (ref 6–23)
CO2: 31 meq/L (ref 19–32)
Calcium: 9.3 mg/dL (ref 8.4–10.5)
Chloride: 102 meq/L (ref 96–112)
Creatinine, Ser: 1.12 mg/dL (ref 0.40–1.50)
GFR: 78.9 mL/min (ref >60.00)
Glucose, Bld: 94 mg/dL (ref 70–99)
Potassium: 4.3 meq/L (ref 3.5–5.1)
Sodium: 140 meq/L (ref 135–145)
Total Bilirubin: 0.6 mg/dL (ref 0.2–1.2)
Total Protein: 7.1 g/dL (ref 6.0–8.3)

## 2023-03-04 LAB — MICROALBUMIN / CREATININE URINE RATIO
Creatinine,U: 59.8 mg/dL
Microalb Creat Ratio: 1.2 mg/g (ref 0.0–30.0)
Microalb, Ur: <0.7 mg/dL (ref 0.0–1.9)

## 2023-03-04 LAB — POC URINALSYSI DIPSTICK (AUTOMATED)
Bilirubin, UA: NEGATIVE
Blood, UA: NEGATIVE
Glucose, UA: POSITIVE — AB
Ketones, UA: NEGATIVE
Leukocytes, UA: NEGATIVE
Nitrite, UA: NEGATIVE
Protein, UA: NEGATIVE
Spec Grav, UA: 1.01 (ref 1.010–1.025)
Urobilinogen, UA: 0.2 U/dL
pH, UA: 7 (ref 5.0–8.0)

## 2023-03-04 LAB — LIPID PANEL
Cholesterol: 129 mg/dL (ref 0–200)
HDL: 34.5 mg/dL — ABNORMAL LOW (ref >39.00)
LDL Cholesterol: 70 mg/dL (ref 0–99)
NonHDL: 94.65
Total CHOL/HDL Ratio: 4
Triglycerides: 124 mg/dL (ref 0.0–149.0)
VLDL: 24.8 mg/dL (ref 0.0–40.0)

## 2023-03-04 LAB — URIC ACID: Uric Acid, Serum: 6.2 mg/dL (ref 4.0–7.8)

## 2023-03-04 LAB — VITAMIN B12: Vitamin B-12: 279 pg/mL (ref 211–911)

## 2023-03-04 LAB — LIPASE: Lipase: 62 U/L — ABNORMAL HIGH (ref 11.0–59.0)

## 2023-03-04 LAB — HEMOGLOBIN A1C: Hgb A1c MFr Bld: 6.6 % — ABNORMAL HIGH (ref 4.6–6.5)

## 2023-03-04 MED ORDER — DAPAGLIFLOZIN PROPANEDIOL 10 MG PO TABS: 10.0000 mg | ORAL_TABLET | Freq: Every day | ORAL | 4 refills | Status: DC

## 2023-03-04 MED ORDER — FENOFIBRATE 145 MG PO TABS: 145.0000 mg | ORAL_TABLET | Freq: Every day | ORAL | 4 refills | Status: DC

## 2023-03-04 MED ORDER — ATORVASTATIN CALCIUM 40 MG PO TABS: 40.0000 mg | ORAL_TABLET | Freq: Every day | ORAL | 4 refills | Status: DC

## 2023-03-04 MED ORDER — SILDENAFIL CITRATE 50 MG PO TABS: 50.0000 mg | ORAL_TABLET | Freq: Every day | ORAL | 0 refills | Status: DC | PRN

## 2023-03-04 MED ORDER — GLIMEPIRIDE 4 MG PO TABS: 4.0000 mg | ORAL_TABLET | Freq: Every day | ORAL | 4 refills | Status: DC

## 2023-03-04 MED ORDER — PIOGLITAZONE HCL 30 MG PO TABS: 30.0000 mg | ORAL_TABLET | Freq: Every day | ORAL | 4 refills | Status: DC

## 2023-03-04 MED ORDER — POLYETHYLENE GLYCOL 3350 17 GM/SCOOP PO POWD: 8.5000 g | Freq: Every day | ORAL | 0 refills | Status: AC | PRN

## 2023-03-04 NOTE — Assessment & Plan Note (Signed)
Update urate off gout medication. No recent gout flares.

## 2023-03-04 NOTE — Assessment & Plan Note (Signed)
Chronic on statin and fenofibrate - update FLP and LFTs.  The 10-year ASCVD risk score (Arnett DK, et al., 2019) is: 3.3%   Values used to calculate the score:     Age: 47 years     Sex: Male     Is Non-Hispanic African American: No     Diabetic: Yes     Tobacco smoker: No     Systolic Blood Pressure: 120 mmHg     Is BP treated: No     HDL Cholesterol: 36.5 mg/dL     Total Cholesterol: 143 mg/dL

## 2023-03-04 NOTE — Assessment & Plan Note (Signed)
Preventative protocols reviewed and updated unless pt declined. Discussed healthy diet and lifestyle.  

## 2023-03-04 NOTE — Assessment & Plan Note (Signed)
Chronic, stable on 4 drug regimen - update A1c with labs including B12.  He has had trouble tolerating 5mg  mounjaro weekly (constipation and bilateral upper abd cramping) - spaces out Q2 wks helps. He is only taking 1/2 tab farxiga 10mg  daily.  Check Lipase today.

## 2023-03-04 NOTE — Assessment & Plan Note (Addendum)
Continue to encourage healthy diet and lifestyle changes to affect sustainable weight loss.

## 2023-03-04 NOTE — Assessment & Plan Note (Signed)
Update LFTs.  Continue Mounjaro and Actos.

## 2023-03-04 NOTE — Assessment & Plan Note (Signed)
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  Discussed etiology of organic ED as well as treatment options.  Will trial Viagra 50mg  daily PRN relations.  Reviewed side effects and adverse effects to monitor for including but not limited to headache, flushing, indigestion, chest pain, and priapism. Discussed need to avoid all forms of nitrates while taking this medication.  Discussed need to seek care if develops chest pain with sex.

## 2023-03-04 NOTE — Progress Notes (Signed)
Ph: [phone] Fax: [phone]   Patient ID: Todd Braun, male    DOB: October 04, 1975, 47 y.o.   MRN: [id number]  This visit was conducted in person.  BP 120/76   Pulse 83   Temp 98 F (36.7 C) (Oral)   Ht 5' 2.75" (1.594 m)   Wt 233 lb 4 oz (105.8 kg)   SpO2 93%   BMI 41.65 kg/m    CC: CPE  Subjective:   HPI: Todd Braun is a 47 y.o. male presenting on 03/04/2023 for Annual Exam   Last CPE 03/12/2022. We checked with insurance - he is allowed one physical per calendar year  DM - does not regularly check sugars. Compliant with antihyperglycemic regimen which includes: farxiga 10mg  daily (cuts in half), glimepiride 4mg  daily, mounjaro 5mg  weekly (takes every other week due to cramping to upper abdomen, bilateral flank discomfort, constipation) and actos 30mg  daily. Metformin intolerance. Denies low sugars or hypoglycemic symptoms. Denies paresthesias, blurry vision. Last diabetic eye exam 10/2022. Glucometer brand: unsure. Last foot exam: 08/2022. DSME: declined. Lab Results  Component Value Date   HGBA1C 6.4 (A) 09/02/2022   Diabetic Foot Exam - Simple   No data filed    Lab Results  Component Value Date   MICROALBUR <0.7 03/12/2022   Notes some trouble maintaining erection - recently started over the past month. Sex drive is ok.   Preventative:  COLONOSCOPY 10/2019 - TA, rpt 7 yrs ([facility]).  No fmhx colon or prostate cancer  Flu shot declined COVID vaccine Pzifer 06/2019, 07/2019, booster 02/2020  Pneumonia shot - to consider  Tdap 2013, to consider booster Shingrix - not due Seat belt use discussed  Sunscreen use discussed. No changing moles on skin.  Sleep - averaging 7 hours/night - bedtime at 1-2am (works second shift) Non smoker  Alcohol - <1 beer/day  Dentist - Q6 mo  Eye exam - yearly   Caffeine: 3-4 cups/night coffee  Lives with wife, son (2005) and twins (2012), 1 outside Medical laboratory scientific officer  Occupation: Chief Financial Officer at [employer]  Activity: active at work -  runs some at work  Diet: good water, fruits/vegetables occasionally      Relevant past medical, surgical, family and social history reviewed and updated as indicated. Interim medical history since our last visit reviewed. Allergies and medications reviewed and updated. Outpatient Medications Prior to Visit  Medication Sig Dispense Refill   Blood Glucose Monitoring Suppl (BLOOD GLUCOSE METER KIT AND SUPPLIES) Use as instructed 1 each 0   glucose blood test strip Check 1-2 times daily and PRN E11.40. LG brand meter. 180 each 3   Lancets 30G MISC 1 each by Does not apply route daily. 100 each 3   scopolamine (TRANSDERM-SCOP, 1.5 MG,) 1 MG/3DAYS Place 1 patch (1.5 mg total) onto the skin every 3 (three) days. 4 patch 1   tirzepatide (MOUNJARO) 5 MG/0.5ML Pen Inject 5 mg into the skin once a week. 6 mL 3   atorvastatin (LIPITOR) 40 MG tablet Take 1 tablet (40 mg total) by mouth daily. 90 tablet 4   dapagliflozin propanediol (FARXIGA) 10 MG TABS tablet Take 1 tablet (10 mg total) by mouth daily. 90 tablet 1   fenofibrate (TRICOR) 145 MG tablet Take 1 tablet (145 mg total) by mouth daily. 90 tablet 4   glimepiride (AMARYL) 4 MG tablet Take 1 tablet (4 mg total) by mouth daily. 90 tablet 4   pioglitazone (ACTOS) 30 MG tablet Take 1 tablet (30 mg total) by  mouth daily. 90 tablet 4   traMADol (ULTRAM) 50 MG tablet Take 1 tablet (50 mg total) by mouth 2 (two) times daily as needed. 40 tablet 0   No facility-administered medications prior to visit.     Per HPI unless specifically indicated in ROS section below Review of Systems  Constitutional:  Negative for activity change, appetite change, chills, fatigue, fever and unexpected weight change.  HENT:  Negative for hearing loss.   Eyes:  Negative for visual disturbance.  Respiratory:  Positive for cough (intermittent tickle in back of throat for a month). Negative for chest tightness, shortness of breath and wheezing.   Cardiovascular:  Negative for  chest pain, palpitations and leg swelling.  Gastrointestinal:  Positive for abdominal pain (occ discomfort, not epigastric pain) and constipation. Negative for abdominal distention, blood in stool, diarrhea, nausea and vomiting.  Genitourinary:  Negative for difficulty urinating and hematuria.  Musculoskeletal:  Negative for arthralgias, myalgias and neck pain.  Skin:  Negative for rash.  Neurological:  Negative for dizziness, seizures, syncope and headaches.  Hematological:  Negative for adenopathy. Does not bruise/bleed easily.  Psychiatric/Behavioral:  Negative for dysphoric mood. The patient is not nervous/anxious.     Objective:  BP 120/76   Pulse 83   Temp 98 F (36.7 C) (Oral)   Ht 5' 2.75" (1.594 m)   Wt 233 lb 4 oz (105.8 kg)   SpO2 93%   BMI 41.65 kg/m   Wt Readings from Last 3 Encounters:  03/04/23 233 lb 4 oz (105.8 kg)  09/02/22 232 lb 4 oz (105.3 kg)  03/12/22 230 lb (104.3 kg)      Physical Exam Vitals and nursing note reviewed.  Constitutional:      General: He is not in acute distress.    Appearance: Normal appearance. He is well-developed. He is not ill-appearing.  HENT:     Head: Normocephalic and atraumatic.     Right Ear: Hearing, tympanic membrane, ear canal and external ear normal.     Left Ear: Hearing, tympanic membrane, ear canal and external ear normal.     Mouth/Throat:     Mouth: Mucous membranes are moist.     Pharynx: Oropharynx is clear. No oropharyngeal exudate or posterior oropharyngeal erythema.  Eyes:     General: No scleral icterus.    Extraocular Movements: Extraocular movements intact.     Conjunctiva/sclera: Conjunctivae normal.     Pupils: Pupils are equal, round, and reactive to light.  Neck:     Thyroid: No thyroid mass or thyromegaly.  Cardiovascular:     Rate and Rhythm: Normal rate and regular rhythm.     Pulses: Normal pulses.          Radial pulses are 2+ on the right side and 2+ on the left side.     Heart sounds:  Normal heart sounds. No murmur heard. Pulmonary:     Effort: Pulmonary effort is normal. No respiratory distress.     Breath sounds: Normal breath sounds. No wheezing, rhonchi or rales.  Abdominal:     General: Bowel sounds are normal. There is no distension.     Palpations: Abdomen is soft. There is no mass.     Tenderness: There is no abdominal tenderness. There is no guarding or rebound.     Hernia: No hernia is present.  Musculoskeletal:        General: Normal range of motion.     Cervical back: Normal range of motion and neck supple.  Right lower leg: No edema.     Left lower leg: No edema.  Lymphadenopathy:     Cervical: No cervical adenopathy.  Skin:    General: Skin is warm and dry.     Findings: No rash.  Neurological:     General: No focal deficit present.     Mental Status: He is alert and oriented to person, place, and time.  Psychiatric:        Mood and Affect: Mood normal.        Behavior: Behavior normal.        Thought Content: Thought content normal.        Judgment: Judgment normal.       Results for orders placed or performed in visit on 03/04/23  POCT Urinalysis Dipstick (Automated)  Result Value Ref Range   Color, UA light yellow    Clarity, UA clear    Glucose, UA Positive (A) Negative   Bilirubin, UA negative    Ketones, UA negative    Spec Grav, UA 1.010 1.010 - 1.025   Blood, UA negative    pH, UA 7.0 5.0 - 8.0   Protein, UA Negative Negative   Urobilinogen, UA 0.2 0.2 or 1.0 E.U./dL   Nitrite, UA negative    Leukocytes, UA Negative Negative    Assessment & Plan:   Problem List Items Addressed This Visit     Health maintenance examination - Primary (Chronic)    Preventative protocols reviewed and updated unless pt declined. Discussed healthy diet and lifestyle.       Obesity, morbid, BMI 40.0-49.9 (HCC)    Continue to encourage healthy diet and lifestyle changes to affect sustainable weight loss      Relevant Medications    dapagliflozin propanediol (FARXIGA) 10 MG TABS tablet   glimepiride (AMARYL) 4 MG tablet   pioglitazone (ACTOS) 30 MG tablet   Gout    Update urate off gout medication. No recent gout flares.      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  The 10-year ASCVD risk score (Arnett DK, et al., 2019) is: 3.3%   Values used to calculate the score:     Age: 79 years     Sex: Male     Is Non-Hispanic African American: No     Diabetic: Yes     Tobacco smoker: No     Systolic Blood Pressure: 120 mmHg     Is BP treated: No     HDL Cholesterol: 36.5 mg/dL     Total Cholesterol: 143 mg/dL       Relevant Medications   atorvastatin (LIPITOR) 40 MG tablet   dapagliflozin propanediol (FARXIGA) 10 MG TABS tablet   fenofibrate (TRICOR) 145 MG tablet   glimepiride (AMARYL) 4 MG tablet   pioglitazone (ACTOS) 30 MG tablet   Other Relevant Orders   Lipid panel   Comprehensive metabolic panel   Type 2 diabetes mellitus with diabetic neuropathy, unspecified (HCC)    Chronic, stable on 4 drug regimen - update A1c with labs including B12.  He has had trouble tolerating 5mg  mounjaro weekly (constipation and bilateral upper abd cramping) - spaces out Q2 wks helps. He is only taking 1/2 tab farxiga 10mg  daily.  Check Lipase today.       Relevant Medications   atorvastatin (LIPITOR)  40 MG tablet   dapagliflozin propanediol (FARXIGA) 10 MG TABS tablet   glimepiride (AMARYL) 4 MG tablet   pioglitazone (ACTOS) 30 MG tablet   Other Relevant Orders   Lipase   Hemoglobin A1c   Microalbumin / creatinine urine ratio   Vitamin B12   NASH (nonalcoholic steatohepatitis)    Update LFTs.  Continue Mounjaro and Actos.      Relevant Orders   CBC with Differential/Platelet   Erectile dysfunction    Presume vasculogenic ED vs med  related.  Discussed etiology of organic ED as well as treatment options.  Will trial Viagra 50mg  daily PRN relations.  Reviewed side effects and adverse effects to monitor for including but not limited to headache, flushing, indigestion, chest pain, and priapism. Discussed need to avoid all forms of nitrates while taking this medication.  Discussed need to seek care if develops chest pain with sex.       Constipation    Mounjaro related.  Discussed spacing out injections. Recommend regular bowel regimen to use OTC Miralax PRN.       Other Visit Diagnoses     Flank pain       Relevant Orders   POCT Urinalysis Dipstick (Automated) (Completed)        Meds ordered this encounter  Medications   polyethylene glycol powder (GLYCOLAX/MIRALAX) 17 GM/SCOOP powder    Sig: Take 8.5-17 g by mouth daily as needed for moderate constipation.    Dispense:  3350 g    Refill:  0   atorvastatin (LIPITOR) 40 MG tablet    Sig: Take 1 tablet (40 mg total) by mouth daily.    Dispense:  90 tablet    Refill:  4   dapagliflozin propanediol (FARXIGA) 10 MG TABS tablet    Sig: Take 1 tablet (10 mg total) by mouth daily.    Dispense:  90 tablet    Refill:  4   fenofibrate (TRICOR) 145 MG tablet    Sig: Take 1 tablet (145 mg total) by mouth daily.    Dispense:  90 tablet    Refill:  4   glimepiride (AMARYL) 4 MG tablet    Sig: Take 1 tablet (4 mg total) by mouth daily.    Dispense:  90 tablet    Refill:  4   pioglitazone (ACTOS) 30 MG tablet    Sig: Take 1 tablet (30 mg total) by mouth daily.    Dispense:  90 each    Refill:  4   sildenafil (VIAGRA) 50 MG tablet    Sig: Take 1 tablet (50 mg total) by mouth daily as needed for erectile dysfunction.    Dispense:  10 tablet    Refill:  0    Orders Placed This Encounter  Procedures   Lipase   Hemoglobin A1c   Microalbumin / creatinine urine ratio   Lipid panel   Comprehensive metabolic panel   Vitamin B12   Uric acid   CBC with  Differential/Platelet   POCT Urinalysis Dipstick (Automated)    Patient Instructions  Labs today  Urinalysis today  Consider Prevnar-20 (one time pneumonia shot)  Start bowel regimen daily - miralax 1/2 capful in 8 oz liquid daily. This is over the counter.  Good to see you today Return in 6 months for follow up visit   Follow up plan: Return in about 6 months (around 09/02/2023) for follow up visit.  Eustaquio Boyden, MD

## 2023-03-04 NOTE — Assessment & Plan Note (Signed)
Mounjaro related.  Discussed spacing out injections. Recommend regular bowel regimen to use OTC Miralax PRN.

## 2023-03-04 NOTE — Patient Instructions (Addendum)
Labs today  Urinalysis today  Consider Prevnar-20 (one time pneumonia shot)  Start bowel regimen daily - miralax 1/2 capful in 8 oz liquid daily. This is over the counter.  Good to see you today Return in 6 months for follow up visit

## 2023-04-01 ENCOUNTER — Telehealth: Payer: Self-pay

## 2023-04-01 NOTE — Telephone Encounter (Signed)
 Received faxed PA request from [facility] for Farxiga 10 mg tab.   Plz submit PA due to metformin intolerance- diarrhea. Tried 500 mg BID 02/12/12- 05/19/12; 750 mg daily 05/19/12- 02/23/13 and 1000 mg daily 07/22/14- 12/31/14.

## 2023-04-03 ENCOUNTER — Telehealth: Payer: Self-pay

## 2023-04-03 ENCOUNTER — Other Ambulatory Visit (HOSPITAL_COMMUNITY): Payer: Self-pay

## 2023-04-03 NOTE — Telephone Encounter (Signed)
 Pharmacy Patient Advocate Encounter  Received notification from [facility] that Prior Authorization for Farxiga  10MG  tablets  has been APPROVED from 04/03/2023 to 04/01/2024. Ran test claim, Copay is $50.00. This test claim was processed through [facility]- copay amounts may vary at other pharmacies due to pharmacy/plan contracts, or as the patient moves through the different stages of their insurance plan.   PA #/Case ID/Reference #: [id number]

## 2023-04-03 NOTE — Telephone Encounter (Signed)
 PA request has been Approved. New Encounter created for follow up. For additional info see Pharmacy Prior Auth telephone encounter from 01/10.

## 2023-04-06 NOTE — Telephone Encounter (Signed)
 Noted.

## 2023-09-02 ENCOUNTER — Ambulatory Visit (INDEPENDENT_AMBULATORY_CARE_PROVIDER_SITE_OTHER): Payer: BC Managed Care – PPO | Admitting: Family Medicine

## 2023-09-02 ENCOUNTER — Encounter: Payer: Self-pay | Admitting: Family Medicine

## 2023-09-02 VITALS — BP 110/74 | HR 72 | Temp 97.8°F | Ht 62.75 in | Wt 235.1 lb

## 2023-09-02 DIAGNOSIS — Z7985 Long-term (current) use of injectable non-insulin antidiabetic drugs: Secondary | ICD-10-CM

## 2023-09-02 DIAGNOSIS — Z7984 Long term (current) use of oral hypoglycemic drugs: Secondary | ICD-10-CM | POA: Diagnosis not present

## 2023-09-02 DIAGNOSIS — E1140 Type 2 diabetes mellitus with diabetic neuropathy, unspecified: Secondary | ICD-10-CM | POA: Diagnosis not present

## 2023-09-02 DIAGNOSIS — R109 Unspecified abdominal pain: Secondary | ICD-10-CM | POA: Diagnosis not present

## 2023-09-02 LAB — POCT GLYCOSYLATED HEMOGLOBIN (HGB A1C): Hemoglobin A1C: 6.1 % — AB (ref 4.0–5.6)

## 2023-09-02 MED ORDER — SILDENAFIL CITRATE 100 MG PO TABS: 50.0000 mg | ORAL_TABLET | Freq: Every day | ORAL | 3 refills | Status: AC | PRN

## 2023-09-02 NOTE — Patient Instructions (Signed)
 Continue current medicines Sugars are doing well. Drop glimepiride  to 1/2 tablet (2mg ) daily. Labs today to check pancreas and liver.  Return in 6 months for physical

## 2023-09-02 NOTE — Assessment & Plan Note (Signed)
 Chronic, great control on current regimen - continue.  Continue current regimen including farxiga  10mg  1/2 tab (5mg ) and he is planning to restart mounjaro .  Will drop glimepiride  to 2mg  daily.  Continue actos  30mg  daily.

## 2023-09-02 NOTE — Assessment & Plan Note (Signed)
?  GLP1RA/GIP related as it seems to come on the longer he's on mounjaro . Last lipase slightly elevated - repeat today with LFTs.  No abdominal pain symptoms. Not consistent with kidney stone.  Had R sided abd pain with Ozempic  use.

## 2023-09-02 NOTE — Progress Notes (Signed)
 Ph: [phone] Fax: [phone]   Patient ID: Todd Braun, male    DOB: [birth year], 48 y.o.   MRN: [id number]  This visit was conducted in person.  BP 110/74   Pulse 72   Temp 97.8 F (36.6 C) (Oral)   Ht 5' 2.75 (1.594 m)   Wt 235 lb 2 oz (106.7 kg)   SpO2 95%   BMI 41.98 kg/m    CC: 6 mo DM f/u visit  Subjective:   HPI: Todd Braun is a 48 y.o. male presenting on 09/02/2023 for Medical Management of Chronic Issues (Here for 6 mo DM f/u.)   He held mounjaro  2 wks ago - back started hurting to R>L flank region. Cramping ache. Denies inciting trauma/injury or falls. No abdominal pain.  Planning to restart once back feels better. Normally takes q10 days.   DM - does not regularly check sugars - last checked a month ago. Compliant with antihyperglycemic regimen which includes: amaryl  4mg  daily, farxiga  10mg  daily 1/2 tab daily (whole tablet had too much diuretic effect), actos  30mg  daily, mounjaro  5mg  weekly. Metformin  intolerance. Denies low sugars or hypoglycemic symptoms. Denies paresthesias, blurry vision. Last diabetic eye exam 10/2022. Glucometer brand: unsure. Last foot exam: DUE. DSME: declined. Lab Results  Component Value Date   HGBA1C 6.1 (A) 09/02/2023   Diabetic Foot Exam - Simple   Simple Foot Form Diabetic Foot exam was performed with the following findings: Yes 09/02/2023 10:30 AM  Visual Inspection No deformities, no ulcerations, no other skin breakdown bilaterally: Yes Sensation Testing Intact to touch and monofilament testing bilaterally: Yes Pulse Check Posterior Tibialis and Dorsalis pulse intact bilaterally: Yes Comments No claudication    Lab Results  Component Value Date   MICROALBUR <0.7 03/04/2023         Relevant past medical, surgical, family and social history reviewed and updated as indicated. Interim medical history since our last visit reviewed. Allergies and medications reviewed and updated. Outpatient Medications Prior to Visit   Medication Sig Dispense Refill   atorvastatin  (LIPITOR) 40 MG tablet Take 1 tablet (40 mg total) by mouth daily. 90 tablet 4   Blood Glucose Monitoring Suppl (BLOOD GLUCOSE METER KIT AND SUPPLIES) Use as instructed 1 each 0   dapagliflozin  propanediol (FARXIGA ) 10 MG TABS tablet Take 1 tablet (10 mg total) by mouth daily. 90 tablet 4   fenofibrate  (TRICOR ) 145 MG tablet Take 1 tablet (145 mg total) by mouth daily. 90 tablet 4   glimepiride  (AMARYL ) 4 MG tablet Take 1 tablet (4 mg total) by mouth daily. 90 tablet 4   glucose blood test strip Check 1-2 times daily and PRN E11.40. LG brand meter. 180 each 3   Lancets 30G MISC 1 each by Does not apply route daily. 100 each 3   pioglitazone  (ACTOS ) 30 MG tablet Take 1 tablet (30 mg total) by mouth daily. 90 each 4   polyethylene glycol powder (GLYCOLAX /MIRALAX ) 17 GM/SCOOP powder Take 8.5-17 g by mouth daily as needed for moderate constipation. 3350 g 0   scopolamine  (TRANSDERM-SCOP, 1.5 MG,) 1 MG/3DAYS Place 1 patch (1.5 mg total) onto the skin every 3 (three) days. (Patient taking differently: Place 1 patch onto the skin every 3 (three) days. As needed) 4 patch 1   tirzepatide  (MOUNJARO ) 5 MG/0.5ML Pen Inject 5 mg into the skin once a week. 6 mL 3   sildenafil  (VIAGRA ) 50 MG tablet Take 1 tablet (50 mg total) by mouth daily as needed for erectile dysfunction. 10  tablet 0   No facility-administered medications prior to visit.     Per HPI unless specifically indicated in ROS section below Review of Systems  Objective:  BP 110/74   Pulse 72   Temp 97.8 F (36.6 C) (Oral)   Ht 5' 2.75 (1.594 m)   Wt 235 lb 2 oz (106.7 kg)   SpO2 95%   BMI 41.98 kg/m   Wt Readings from Last 3 Encounters:  09/02/23 235 lb 2 oz (106.7 kg)  03/04/23 233 lb 4 oz (105.8 kg)  09/02/22 232 lb 4 oz (105.3 kg)      Physical Exam Vitals and nursing note reviewed.  Constitutional:      Appearance: Normal appearance. He is not ill-appearing.  HENT:     Head:  Normocephalic and atraumatic.     Mouth/Throat:     Mouth: Mucous membranes are moist.     Pharynx: Oropharynx is clear. No oropharyngeal exudate or posterior oropharyngeal erythema.  Eyes:     Extraocular Movements: Extraocular movements intact.     Conjunctiva/sclera: Conjunctivae normal.     Pupils: Pupils are equal, round, and reactive to light.  Neck:     Thyroid : No thyroid  mass or thyromegaly.  Cardiovascular:     Rate and Rhythm: Normal rate and regular rhythm.     Pulses: Normal pulses.     Heart sounds: Normal heart sounds. No murmur heard. Pulmonary:     Effort: Pulmonary effort is normal. No respiratory distress.     Breath sounds: Normal breath sounds. No wheezing, rhonchi or rales.  Abdominal:     General: Bowel sounds are normal. There is no distension.     Palpations: Abdomen is soft. There is no mass.     Tenderness: There is no abdominal tenderness. There is no right CVA tenderness, left CVA tenderness, guarding or rebound.     Hernia: No hernia is present.  Musculoskeletal:     Right lower leg: No edema.     Left lower leg: No edema.     Comments: See HPI for foot exam if done  Skin:    General: Skin is warm and dry.     Findings: No rash.  Neurological:     Mental Status: He is alert.  Psychiatric:        Mood and Affect: Mood normal.        Behavior: Behavior normal.       Results for orders placed or performed in visit on 09/02/23  POCT glycosylated hemoglobin (Hb A1C)   Collection Time: 09/02/23 10:15 AM  Result Value Ref Range   Hemoglobin A1C 6.1 (A) 4.0 - 5.6 %   HbA1c POC (<> result, manual entry)     HbA1c, POC (prediabetic range)     HbA1c, POC (controlled diabetic range)     Lab Results  Component Value Date   CHOL 129 03/04/2023   HDL 34.50 (L) 03/04/2023   LDLCALC 70 03/04/2023   LDLDIRECT 105.0 02/21/2020   TRIG 124.0 03/04/2023   CHOLHDL 4 03/04/2023   Lab Results  Component Value Date   NA 140 03/04/2023   CL 102 03/04/2023    K 4.3 03/04/2023   CO2 31 03/04/2023   BUN 16 03/04/2023   CREATININE 1.12 03/04/2023   GFR 78.90 03/04/2023   CALCIUM  9.3 03/04/2023   ALBUMIN 4.4 03/04/2023   GLUCOSE 94 03/04/2023   Lab Results  Component Value Date   ALT 31 03/04/2023   AST 21 03/04/2023  ALKPHOS 46 03/04/2023   BILITOT 0.6 03/04/2023    Lab Results  Component Value Date   COLORU light yellow 03/04/2023   CLARITYU clear 03/04/2023   GLUCOSEUR Positive (A) 03/04/2023   BILIRUBINUR negative 03/04/2023   KETONESU negative 03/04/2023   SPECGRAV 1.010 03/04/2023   RBCUR negative 03/04/2023   PHUR 7.0 03/04/2023   PROTEINUR Negative 03/04/2023   UROBILINOGEN 0.2 03/04/2023   LEUKOCYTESUR Negative 03/04/2023   Assessment & Plan:   Problem List Items Addressed This Visit     Type 2 diabetes mellitus with diabetic neuropathy, unspecified (HCC) - Primary   Chronic, great control on current regimen - continue.  Continue current regimen including farxiga  10mg  1/2 tab (5mg ) and he is planning to restart mounjaro .  Will drop glimepiride  to 2mg  daily.  Continue actos  30mg  daily.       Relevant Orders   POCT glycosylated hemoglobin (Hb A1C) (Completed)   Hepatic function panel   Lipase   Right flank pain   ?GLP1RA/GIP related as it seems to come on the longer he's on mounjaro . Last lipase slightly elevated - repeat today with LFTs.  No abdominal pain symptoms. Not consistent with kidney stone.  Had R sided abd pain with Ozempic  use.       Relevant Orders   Hepatic function panel   Lipase     Meds ordered this encounter  Medications   sildenafil  (VIAGRA ) 100 MG tablet    Sig: Take 0.5-1 tablets (50-100 mg total) by mouth daily as needed for erectile dysfunction.    Dispense:  30 tablet    Refill:  3    Orders Placed This Encounter  Procedures   Hepatic function panel   Lipase   POCT glycosylated hemoglobin (Hb A1C)    Patient Instructions  Continue current medicines Sugars are doing  well. Drop glimepiride  to 1/2 tablet (2mg ) daily. Labs today to check pancreas and liver.  Return in 6 months for physical  Follow up plan: Return in about 6 months (around 03/03/2024) for annual exam, prior fasting for blood work.  Claire Crick, MD

## 2023-09-03 LAB — LIPASE: Lipase: 68 U/L — ABNORMAL HIGH (ref 7–60)

## 2023-09-03 LAB — HEPATIC FUNCTION PANEL
AG Ratio: 1.6 (calc) (ref 1.0–2.5)
ALT: 39 U/L (ref 9–46)
AST: 20 U/L (ref 10–40)
Albumin: 4.4 g/dL (ref 3.6–5.1)
Alkaline phosphatase (APISO): 52 U/L (ref 36–130)
Bilirubin, Direct: 0.1 mg/dL (ref 0.0–0.2)
Globulin: 2.8 g/dL (ref 1.9–3.7)
Indirect Bilirubin: 0.3 mg/dL (ref 0.2–1.2)
Total Bilirubin: 0.4 mg/dL (ref 0.2–1.2)
Total Protein: 7.2 g/dL (ref 6.1–8.1)

## 2023-09-05 ENCOUNTER — Ambulatory Visit: Payer: Self-pay | Admitting: Family Medicine

## 2023-09-18 ENCOUNTER — Encounter: Payer: Self-pay | Admitting: Family Medicine

## 2023-09-21 NOTE — Telephone Encounter (Signed)
 See 09/05/23 phn note.   Looks like sildenafil  isn't covered. Printed letter and placed in Dr Talmadge box.

## 2023-09-22 ENCOUNTER — Telehealth: Payer: Self-pay

## 2023-09-22 ENCOUNTER — Other Ambulatory Visit (HOSPITAL_COMMUNITY): Payer: Self-pay

## 2023-09-22 NOTE — Telephone Encounter (Signed)
 Thank you. Plz notify patient.

## 2023-09-22 NOTE — Telephone Encounter (Signed)
 Noted (see other messages by clicking blue '[address] [place]' link).

## 2023-09-22 NOTE — Telephone Encounter (Signed)
Please submit PA for viagra.

## 2023-09-22 NOTE — Telephone Encounter (Signed)
 Pharmacy Patient Advocate Encounter   Received notification from Pt Calls Messages that prior authorization for Sildenafil  100 is required/requested.   Insurance verification completed.   The patient is insured through [employer] .   Per test claim: PA required; PA submitted to above mentioned insurance via CoverMyMeds Key/confirmation #/EOC [facility] Status is pending

## 2023-09-22 NOTE — Telephone Encounter (Signed)
 Pharmacy Patient Advocate Encounter  Received notification from [facility] that Prior Authorization for Sildenafil  100 has been APPROVED from 08/23/23 to 09/21/24 for a quantity limit of 8 tablets per 30 days. Ran test claim, Copay is $10.00 for 8. This test claim was processed through [facility]- copay amounts may vary at other pharmacies due to pharmacy/plan contracts, or as the patient moves through the different stages of their insurance plan.   PA #/Case ID/Reference #: [id number]

## 2023-09-23 NOTE — Telephone Encounter (Signed)
 Spoke with pt informing him of PA approval for 8 tabs per 30 days and cost of $10.00. Pt verbalizes understanding and expresses his thanks.

## 2023-10-29 DIAGNOSIS — M531 Cervicobrachial syndrome: Secondary | ICD-10-CM | POA: Diagnosis not present

## 2023-10-29 DIAGNOSIS — M62830 Muscle spasm of back: Secondary | ICD-10-CM | POA: Diagnosis not present

## 2023-10-29 DIAGNOSIS — M9901 Segmental and somatic dysfunction of cervical region: Secondary | ICD-10-CM | POA: Diagnosis not present

## 2023-10-29 DIAGNOSIS — M9902 Segmental and somatic dysfunction of thoracic region: Secondary | ICD-10-CM | POA: Diagnosis not present

## 2023-10-30 DIAGNOSIS — M9902 Segmental and somatic dysfunction of thoracic region: Secondary | ICD-10-CM | POA: Diagnosis not present

## 2023-10-30 DIAGNOSIS — M9901 Segmental and somatic dysfunction of cervical region: Secondary | ICD-10-CM | POA: Diagnosis not present

## 2023-10-30 DIAGNOSIS — M531 Cervicobrachial syndrome: Secondary | ICD-10-CM | POA: Diagnosis not present

## 2023-10-30 DIAGNOSIS — M62830 Muscle spasm of back: Secondary | ICD-10-CM | POA: Diagnosis not present

## 2023-11-02 DIAGNOSIS — M62830 Muscle spasm of back: Secondary | ICD-10-CM | POA: Diagnosis not present

## 2023-11-02 DIAGNOSIS — M531 Cervicobrachial syndrome: Secondary | ICD-10-CM | POA: Diagnosis not present

## 2023-11-02 DIAGNOSIS — M9902 Segmental and somatic dysfunction of thoracic region: Secondary | ICD-10-CM | POA: Diagnosis not present

## 2023-11-02 DIAGNOSIS — M9901 Segmental and somatic dysfunction of cervical region: Secondary | ICD-10-CM | POA: Diagnosis not present

## 2023-11-03 ENCOUNTER — Encounter: Payer: Self-pay | Admitting: Internal Medicine

## 2023-11-03 ENCOUNTER — Ambulatory Visit (INDEPENDENT_AMBULATORY_CARE_PROVIDER_SITE_OTHER): Admitting: Internal Medicine

## 2023-11-03 VITALS — BP 114/78 | HR 96 | Temp 98.5°F | Ht 62.75 in | Wt 233.0 lb

## 2023-11-03 DIAGNOSIS — R29898 Other symptoms and signs involving the musculoskeletal system: Secondary | ICD-10-CM | POA: Insufficient documentation

## 2023-11-03 MED ORDER — PREDNISONE 20 MG PO TABS: 40.0000 mg | ORAL_TABLET | Freq: Every day | ORAL | 0 refills | Status: DC

## 2023-11-03 MED ORDER — TIZANIDINE HCL 2 MG PO TABS: 2.0000 mg | ORAL_TABLET | Freq: Every evening | ORAL | 0 refills | Status: DC | PRN

## 2023-11-03 NOTE — Progress Notes (Signed)
 Subjective:    Patient ID: Todd Braun, male    DOB: [birth year], 48 y.o.   MRN: [id number]  HPI Here with concern about a pinched nerve in his neck  Started 9 days ago Right arm feels weak Getting pain in neck  Went to the chiropractor--told it was a pinched nerve after x-ray Has gotten massage---electrical stimulation Is better--arm moves better but pain comes back if in the wrong position  No known injury--just switched pillow after not sleeping well Went back to old pillow  Using tylenol 3 tabs and aleve and ibuprofen (800mg )---not really helping  Current Outpatient Medications on File Prior to Visit  Medication Sig Dispense Refill   atorvastatin  (LIPITOR) 40 MG tablet Take 1 tablet (40 mg total) by mouth daily. 90 tablet 4   Blood Glucose Monitoring Suppl (BLOOD GLUCOSE METER KIT AND SUPPLIES) Use as instructed 1 each 0   dapagliflozin  propanediol (FARXIGA ) 10 MG TABS tablet Take 1 tablet (10 mg total) by mouth daily. 90 tablet 4   fenofibrate  (TRICOR ) 145 MG tablet Take 1 tablet (145 mg total) by mouth daily. 90 tablet 4   glimepiride  (AMARYL ) 4 MG tablet Take 1 tablet (4 mg total) by mouth daily. 90 tablet 4   glucose blood test strip Check 1-2 times daily and PRN E11.40. LG brand meter. 180 each 3   Lancets 30G MISC 1 each by Does not apply route daily. 100 each 3   pioglitazone  (ACTOS ) 30 MG tablet Take 1 tablet (30 mg total) by mouth daily. 90 each 4   polyethylene glycol powder (GLYCOLAX /MIRALAX ) 17 GM/SCOOP powder Take 8.5-17 g by mouth daily as needed for moderate constipation. 3350 g 0   sildenafil  (VIAGRA ) 100 MG tablet Take 0.5-1 tablets (50-100 mg total) by mouth daily as needed for erectile dysfunction. 30 tablet 3   tirzepatide  (MOUNJARO ) 5 MG/0.5ML Pen Inject 5 mg into the skin every 14 (fourteen) days. (Patient not taking: Reported on 11/03/2023)     No current facility-administered medications on file prior to visit.    Allergies  Allergen Reactions    Metformin  And Related Diarrhea    IR formulation caused GI upset and diarrhea to point of hematochezia    Past Medical History:  Diagnosis Date   Arthritis    Gallbladder polyp 05/2013   Gout    HLD (hyperlipidemia)    NASH (nonalcoholic steatohepatitis) 05/2013   with transaminitis   Obesity    T2DM (type 2 diabetes mellitus) (HCC)     Past Surgical History:  Procedure Laterality Date   APPENDECTOMY  1996   COLONOSCOPY  10/2019   TA, rpt 7 yrs ([facility])   FOOT SURGERY  2013   right for arthritis   TONSILLECTOMY  childhood    Family History  Problem Relation Age of Onset   Diabetes Father    CAD Neg Hx    Stroke Neg Hx    Cancer Neg Hx    Colon cancer Neg Hx    Esophageal cancer Neg Hx    Pancreatic cancer Neg Hx    Stomach cancer Neg Hx    Liver disease Neg Hx    Rectal cancer Neg Hx     Social History   Socioeconomic History   Marital status: Married    Spouse name: Not on file   Number of children: Not on file   Years of education: Not on file   Highest education level: Not on file  Occupational History   Not on file  Tobacco Use   Smoking status: Former   Smokeless tobacco: Never   Tobacco comments:    smoked occasionally a long time ago  Vaping Use   Vaping status: Never Used  Substance and Sexual Activity   Alcohol use: Yes    Comment: Socially-7 drinks per week   Drug use: No   Sexual activity: Not on file  Other Topics Concern   Not on file  Social History Narrative   Caffeine: 3-4 cups/night coffee   Lives with wife, son (2005) and twins (2012), 1 outside cat   Occupation: toolmaker   Edu: [employer]   Activity: walking twice weekly for 30 min   Diet: good water, fruits/vegetables occasionally   Social Drivers of Corporate investment banker Strain: Not on file  Food Insecurity: Not on file  Transportation Needs: Not on file  Physical Activity: Not on file  Stress: Not on file  Social Connections: Not on file  Intimate Partner Violence:  Not on file   Review of Systems Past pinched nerve in back--no prior neck probems     Objective:   Physical Exam Musculoskeletal:     Cervical back: Normal range of motion.  Lymphadenopathy:     Cervical: No cervical adenopathy.  Neurological:     Comments: Normal sensation in right arm Decreased grip strength on right and mild weakness with extension at elbow >flexion            Assessment & Plan:

## 2023-11-03 NOTE — Assessment & Plan Note (Signed)
 Certainly concerning for cervical radiculopathy--but unclear Will try brief course of prednisone  Tizanidine  for sleep May need MRI/CT if no improvement in the next 1-2 weeks

## 2023-11-09 LAB — HM DIABETES EYE EXAM

## 2023-11-10 ENCOUNTER — Other Ambulatory Visit: Payer: Self-pay | Admitting: Internal Medicine

## 2023-11-11 NOTE — Telephone Encounter (Signed)
 ERx

## 2023-12-06 ENCOUNTER — Other Ambulatory Visit: Payer: Self-pay | Admitting: Family Medicine

## 2024-02-10 ENCOUNTER — Other Ambulatory Visit: Payer: Self-pay | Admitting: Family Medicine

## 2024-02-11 NOTE — Telephone Encounter (Signed)
 Name of Medication:Tizanidine  (Zanaflex ) 2mg  #60 Name of Pharmacy:[facility] [address] Last fill date & QTY:11/11/23 Last OV & type: 11/03/23 (RT ARM WEAKNESS) Next OV:03/02/24 Last controlled substance agreement date: Last UDS date:

## 2024-02-11 NOTE — Telephone Encounter (Signed)
 ERx

## 2024-02-28 ENCOUNTER — Other Ambulatory Visit: Payer: Self-pay | Admitting: Family Medicine

## 2024-02-28 DIAGNOSIS — E782 Mixed hyperlipidemia: Secondary | ICD-10-CM

## 2024-02-28 DIAGNOSIS — M1A9XX0 Chronic gout, unspecified, without tophus (tophi): Secondary | ICD-10-CM

## 2024-02-28 DIAGNOSIS — E1140 Type 2 diabetes mellitus with diabetic neuropathy, unspecified: Secondary | ICD-10-CM

## 2024-02-28 DIAGNOSIS — K760 Fatty (change of) liver, not elsewhere classified: Secondary | ICD-10-CM

## 2024-03-02 ENCOUNTER — Other Ambulatory Visit

## 2024-03-02 DIAGNOSIS — M1A9XX0 Chronic gout, unspecified, without tophus (tophi): Secondary | ICD-10-CM

## 2024-03-02 DIAGNOSIS — E1140 Type 2 diabetes mellitus with diabetic neuropathy, unspecified: Secondary | ICD-10-CM | POA: Diagnosis not present

## 2024-03-02 DIAGNOSIS — E782 Mixed hyperlipidemia: Secondary | ICD-10-CM | POA: Diagnosis not present

## 2024-03-02 DIAGNOSIS — K760 Fatty (change of) liver, not elsewhere classified: Secondary | ICD-10-CM | POA: Diagnosis not present

## 2024-03-02 DIAGNOSIS — E1169 Type 2 diabetes mellitus with other specified complication: Secondary | ICD-10-CM

## 2024-03-02 LAB — CBC WITH DIFFERENTIAL/PLATELET
Basophils Absolute: 0 K/uL (ref 0.0–0.1)
Basophils Relative: 0.7 % (ref 0.0–3.0)
Eosinophils Absolute: 0.2 K/uL (ref 0.0–0.7)
Eosinophils Relative: 2.4 % (ref 0.0–5.0)
HCT: 43.4 % (ref 39.0–52.0)
Hemoglobin: 14.3 g/dL (ref 13.0–17.0)
Lymphocytes Relative: 31.3 % (ref 12.0–46.0)
Lymphs Abs: 2.1 K/uL (ref 0.7–4.0)
MCHC: 32.8 g/dL (ref 30.0–36.0)
MCV: 86.4 fl (ref 78.0–100.0)
Monocytes Absolute: 0.4 K/uL (ref 0.1–1.0)
Monocytes Relative: 6.4 % (ref 3.0–12.0)
Neutro Abs: 4 K/uL (ref 1.4–7.7)
Neutrophils Relative %: 59.2 % (ref 43.0–77.0)
Platelets: 184 K/uL (ref 150.0–400.0)
RBC: 5.03 Mil/uL (ref 4.22–5.81)
RDW: 13.9 % (ref 11.5–15.5)
WBC: 6.7 K/uL (ref 4.0–10.5)

## 2024-03-02 LAB — LIPID PANEL
Cholesterol: 168 mg/dL (ref 0–200)
HDL: 35.7 mg/dL — ABNORMAL LOW (ref >39.00)
LDL Cholesterol: 97 mg/dL (ref 0–99)
NonHDL: 132.16
Total CHOL/HDL Ratio: 5
Triglycerides: 178 mg/dL — ABNORMAL HIGH (ref 0.0–149.0)
VLDL: 35.6 mg/dL (ref 0.0–40.0)

## 2024-03-02 LAB — COMPREHENSIVE METABOLIC PANEL WITH GFR
ALT: 51 U/L (ref 0–53)
AST: 29 U/L (ref 0–37)
Albumin: 4.3 g/dL (ref 3.5–5.2)
Alkaline Phosphatase: 59 U/L (ref 39–117)
BUN: 14 mg/dL (ref 6–23)
CO2: 27 meq/L (ref 19–32)
Calcium: 9.6 mg/dL (ref 8.4–10.5)
Chloride: 103 meq/L (ref 96–112)
Creatinine, Ser: 0.89 mg/dL (ref 0.40–1.50)
GFR: 102.21 mL/min (ref >60.00)
Glucose, Bld: 135 mg/dL — ABNORMAL HIGH (ref 70–99)
Potassium: 4.1 meq/L (ref 3.5–5.1)
Sodium: 138 meq/L (ref 135–145)
Total Bilirubin: 0.6 mg/dL (ref 0.2–1.2)
Total Protein: 6.9 g/dL (ref 6.0–8.3)

## 2024-03-02 LAB — HEMOGLOBIN A1C: Hgb A1c MFr Bld: 6.7 % — ABNORMAL HIGH (ref 4.6–6.5)

## 2024-03-02 LAB — URIC ACID: Uric Acid, Serum: 6.4 mg/dL (ref 4.0–7.8)

## 2024-03-02 LAB — MICROALBUMIN / CREATININE URINE RATIO
Creatinine,U: 62.4 mg/dL
Microalb Creat Ratio: UNDETERMINED mg/g (ref 0.0–30.0)
Microalb, Ur: <0.7 mg/dL

## 2024-03-02 LAB — VITAMIN B12: Vitamin B-12: 269 pg/mL (ref 211–911)

## 2024-03-02 LAB — LIPASE: Lipase: 49 U/L (ref 11.0–59.0)

## 2024-03-03 ENCOUNTER — Ambulatory Visit: Payer: Self-pay | Admitting: Family Medicine

## 2024-03-07 ENCOUNTER — Other Ambulatory Visit: Payer: Self-pay | Admitting: Family Medicine

## 2024-03-09 ENCOUNTER — Encounter: Payer: Self-pay | Admitting: Family Medicine

## 2024-03-09 ENCOUNTER — Ambulatory Visit: Admitting: Family Medicine

## 2024-03-09 VITALS — BP 122/80 | HR 84 | Temp 97.7°F | Ht 62.5 in | Wt 235.4 lb

## 2024-03-09 DIAGNOSIS — E1169 Type 2 diabetes mellitus with other specified complication: Secondary | ICD-10-CM | POA: Diagnosis not present

## 2024-03-09 DIAGNOSIS — K824 Cholesterolosis of gallbladder: Secondary | ICD-10-CM

## 2024-03-09 DIAGNOSIS — E1140 Type 2 diabetes mellitus with diabetic neuropathy, unspecified: Secondary | ICD-10-CM

## 2024-03-09 DIAGNOSIS — K760 Fatty (change of) liver, not elsewhere classified: Secondary | ICD-10-CM

## 2024-03-09 DIAGNOSIS — Z7985 Long-term (current) use of injectable non-insulin antidiabetic drugs: Secondary | ICD-10-CM | POA: Diagnosis not present

## 2024-03-09 DIAGNOSIS — K5903 Drug induced constipation: Secondary | ICD-10-CM

## 2024-03-09 DIAGNOSIS — Z0000 Encounter for general adult medical examination without abnormal findings: Secondary | ICD-10-CM

## 2024-03-09 DIAGNOSIS — E782 Mixed hyperlipidemia: Secondary | ICD-10-CM | POA: Diagnosis not present

## 2024-03-09 DIAGNOSIS — Z0001 Encounter for general adult medical examination with abnormal findings: Secondary | ICD-10-CM

## 2024-03-09 DIAGNOSIS — M1A9XX0 Chronic gout, unspecified, without tophus (tophi): Secondary | ICD-10-CM

## 2024-03-09 MED ORDER — GLIMEPIRIDE 4 MG PO TABS: 4.0000 mg | ORAL_TABLET | Freq: Every day | ORAL | 3 refills | Status: AC

## 2024-03-09 MED ORDER — MOUNJARO 5 MG/0.5ML ~~LOC~~ SOAJ: 5.0000 mg | SUBCUTANEOUS | 3 refills | Status: AC

## 2024-03-09 MED ORDER — ATORVASTATIN CALCIUM 40 MG PO TABS: 40.0000 mg | ORAL_TABLET | Freq: Every day | ORAL | 3 refills | Status: AC

## 2024-03-09 MED ORDER — PIOGLITAZONE HCL 30 MG PO TABS: 30.0000 mg | ORAL_TABLET | Freq: Every day | ORAL | 3 refills | Status: AC

## 2024-03-09 MED ORDER — DAPAGLIFLOZIN PROPANEDIOL 10 MG PO TABS: 10.0000 mg | ORAL_TABLET | Freq: Every day | ORAL | 3 refills | Status: AC

## 2024-03-09 MED ORDER — FENOFIBRATE 145 MG PO TABS: 145.0000 mg | ORAL_TABLET | Freq: Every day | ORAL | 3 refills | Status: AC

## 2024-03-09 NOTE — Assessment & Plan Note (Addendum)
 Chronic, stable. Continue current medication regimen.  He notes he's taking 1/2 tablets of oral antihyperglycemics.  Continue mounjaro  5mg  Q2 wks

## 2024-03-09 NOTE — Assessment & Plan Note (Signed)
Stable period off urate lowering medication. ?

## 2024-03-09 NOTE — Assessment & Plan Note (Signed)
 Mounjaro  related

## 2024-03-09 NOTE — Assessment & Plan Note (Signed)
 LFTs normal - update liver US .

## 2024-03-09 NOTE — Assessment & Plan Note (Signed)
 Preventative protocols reviewed and updated unless pt declined. Discussed healthy diet and lifestyle.

## 2024-03-09 NOTE — Patient Instructions (Addendum)
 Consider tetanus shot, consider pneumonia shot.  You are doing well today  Continue current medicines Return as needed or in 6 months for diabetes follow up visit

## 2024-03-09 NOTE — Assessment & Plan Note (Signed)
 Reviewed healthy diet and lifestyle changes to effect sustainable weight loss.

## 2024-03-09 NOTE — Progress Notes (Signed)
 Ph: [phone] Fax: [phone]   Patient ID: Todd Braun, male    DOB: 11/29/1975, 48 y.o.   MRN: [id number]  This visit was conducted in person.  BP 122/80 (Cuff Size: Large)   Pulse 84   Temp 97.7 F (36.5 C) (Oral)   Ht 5' 2.5 (1.588 m)   Wt 235 lb 6.4 oz (106.8 kg)   SpO2 95%   BMI 42.37 kg/m    CC: CPE Subjective:   HPI: Todd Braun is a 48 y.o. male presenting on 03/09/2024 for Annual Exam (Pt has no concerns//Due for tdap)   He is allowed one physical per calendar year   DM - mounjaro  limited dosing (5mg  Q2 wks) due to mildly elevated lipase (60s) and GI symptoms (bilat upper abd cramping). Latest lipase back to normal on lower mounjaro  dose. Metformin  intolerance. He continues farxiga  10mg  daily, amaryl  and actos . Has been taking 1/2 tablets of all of these.  Lab Results  Component Value Date   HGBA1C 6.7 (H) 03/02/2024    Known NASH cirrhosis.  Fibrosis 4 Score = 1.04 (Low risk)       Interpretation for patients with NAFLD          <1.30       -  F0-F1 (Low risk)          1.30-2.67 -  Indeterminate           >2.67      -  F3-F4 (High risk)     Validated for ages 69-65  Preventative:  COLONOSCOPY 10/2019 - TA, rpt 7 yrs ([facility]).  No fmhx colon or prostate cancer  Flu shot declined COVID vaccine Pzifer 06/2019, 07/2019, booster 02/2020  Pneumonia shot - to consider  Tdap 2013, declines rpt, will consider next time Shingrix - not due Seat belt use discussed  Sunscreen use discussed. No changing moles on skin.  Sleep - averaging 7 hours/night - bedtime at 1-2am (works second shift) Non smoker  Alcohol - 1 beer or mixed drink every few weeks  Dentist - Q6 mo  Eye exam - yearly   Caffeine: 3-4 cups/night coffee  Lives with wife, son (2005) and twins (2012), 1 outside medical laboratory scientific officer  Occupation: chief financial officer at [employer]: [employer]  Activity: active at work - runs in place at work , stretching Diet: good water, fruits/vegetables occasionally      Relevant past  medical, surgical, family and social history reviewed and updated as indicated. Interim medical history since our last visit reviewed. Allergies and medications reviewed and updated. Outpatient Medications Prior to Visit  Medication Sig Dispense Refill   Blood Glucose Monitoring Suppl (BLOOD GLUCOSE METER KIT AND SUPPLIES) Use as instructed 1 each 0   glucose blood test strip Check 1-2 times daily and PRN E11.40. LG brand meter. 180 each 3   Lancets 30G MISC 1 each by Does not apply route daily. 100 each 3   sildenafil  (VIAGRA ) 100 MG tablet Take 0.5-1 tablets (50-100 mg total) by mouth daily as needed for erectile dysfunction. 30 tablet 3   tiZANidine  (ZANAFLEX ) 2 MG tablet TAKE 1 TO 2 TABLETS (2-4 MG TOTAL) BY MOUTH AT BEDTIME AS NEEDED FOR MUSCLE SPASM (Patient taking differently: Take 2 mg by mouth as needed.) 180 tablet 0   atorvastatin  (LIPITOR) 40 MG tablet Take 1 tablet (40 mg total) by mouth daily. 90 tablet 4   dapagliflozin  propanediol (FARXIGA ) 10 MG TABS tablet Take 1 tablet (10 mg total) by mouth daily. 90  tablet 4   fenofibrate  (TRICOR ) 145 MG tablet Take 1 tablet (145 mg total) by mouth daily. 90 tablet 4   glimepiride  (AMARYL ) 4 MG tablet Take 1 tablet (4 mg total) by mouth daily. 90 tablet 4   pioglitazone  (ACTOS ) 30 MG tablet Take 1 tablet (30 mg total) by mouth daily. 90 each 4   tirzepatide  (MOUNJARO ) 5 MG/0.5ML Pen INJECT 5 MG SUBCUTANEOUSLY WEEKLY 6 mL 3   polyethylene glycol powder (GLYCOLAX /MIRALAX ) 17 GM/SCOOP powder Take 8.5-17 g by mouth daily as needed for moderate constipation. (Patient not taking: Reported on 03/09/2024) 3350 g 0   predniSONE  (DELTASONE ) 20 MG tablet Take 2 tablets (40 mg total) by mouth daily. For 3 days, then 1 tab daily for 3 days (Patient not taking: Reported on 03/09/2024) 9 tablet 0   No facility-administered medications prior to visit.     Per HPI unless specifically indicated in ROS section below Review of Systems  Constitutional:   Negative for activity change, appetite change, chills, fatigue, fever and unexpected weight change.  HENT:  Negative for hearing loss.   Eyes:  Negative for visual disturbance.  Respiratory:  Positive for cough (mild occ). Negative for chest tightness, shortness of breath and wheezing.   Cardiovascular:  Negative for chest pain, palpitations and leg swelling.  Gastrointestinal:  Positive for diarrhea. Negative for abdominal distention, abdominal pain, blood in stool, constipation, nausea and vomiting.  Genitourinary:  Negative for difficulty urinating and hematuria.  Musculoskeletal:  Negative for arthralgias, myalgias and neck pain.       Intermittent R flank pain   Skin:  Negative for rash.  Neurological:  Negative for dizziness, seizures, syncope and headaches.  Hematological:  Negative for adenopathy. Does not bruise/bleed easily.  Psychiatric/Behavioral:  Negative for dysphoric mood. The patient is not nervous/anxious.     Objective:  BP 122/80 (Cuff Size: Large)   Pulse 84   Temp 97.7 F (36.5 C) (Oral)   Ht 5' 2.5 (1.588 m)   Wt 235 lb 6.4 oz (106.8 kg)   SpO2 95%   BMI 42.37 kg/m   Wt Readings from Last 3 Encounters:  03/09/24 235 lb 6.4 oz (106.8 kg)  11/03/23 233 lb (105.7 kg)  09/02/23 235 lb 2 oz (106.7 kg)      Physical Exam Vitals and nursing note reviewed.  Constitutional:      General: He is not in acute distress.    Appearance: Normal appearance. He is well-developed. He is not ill-appearing.  HENT:     Head: Normocephalic and atraumatic.     Right Ear: Hearing, tympanic membrane, ear canal and external ear normal.     Left Ear: Hearing, tympanic membrane, ear canal and external ear normal.     Mouth/Throat:     Mouth: Mucous membranes are moist.     Pharynx: Oropharynx is clear. No oropharyngeal exudate or posterior oropharyngeal erythema.  Eyes:     General: No scleral icterus.    Extraocular Movements: Extraocular movements intact.      Conjunctiva/sclera: Conjunctivae normal.     Pupils: Pupils are equal, round, and reactive to light.  Neck:     Thyroid : No thyroid  mass or thyromegaly.  Cardiovascular:     Rate and Rhythm: Normal rate and regular rhythm.     Pulses: Normal pulses.          Radial pulses are 2+ on the right side and 2+ on the left side.     Heart sounds: Normal heart sounds.  No murmur heard. Pulmonary:     Effort: Pulmonary effort is normal. No respiratory distress.     Breath sounds: Normal breath sounds. No wheezing, rhonchi or rales.  Abdominal:     General: Bowel sounds are normal. There is no distension.     Palpations: Abdomen is soft. There is no mass.     Tenderness: There is no abdominal tenderness. There is no guarding or rebound.     Hernia: No hernia is present.  Musculoskeletal:        General: Normal range of motion.     Cervical back: Normal range of motion and neck supple.     Right lower leg: No edema.     Left lower leg: No edema.  Lymphadenopathy:     Cervical: No cervical adenopathy.  Skin:    General: Skin is warm and dry.     Findings: No rash.  Neurological:     General: No focal deficit present.     Mental Status: He is alert and oriented to person, place, and time.  Psychiatric:        Mood and Affect: Mood normal.        Behavior: Behavior normal.        Thought Content: Thought content normal.        Judgment: Judgment normal.       Results for orders placed or performed in visit on 03/02/24  Lipase   Collection Time: 03/02/24  9:56 AM  Result Value Ref Range   Lipase 49.0 11.0 - 59.0 U/L  Vitamin B12   Collection Time: 03/02/24  9:56 AM  Result Value Ref Range   Vitamin B-12 269 211 - 911 pg/mL  Uric acid   Collection Time: 03/02/24  9:56 AM  Result Value Ref Range   Uric Acid, Serum 6.4 4.0 - 7.8 mg/dL  CBC with Differential/Platelet   Collection Time: 03/02/24  9:56 AM  Result Value Ref Range   WBC 6.7 4.0 - 10.5 K/uL   RBC 5.03 4.22 - 5.81 Mil/uL    Hemoglobin 14.3 13.0 - 17.0 g/dL   HCT 56.5 60.9 - 47.9 %   MCV 86.4 78.0 - 100.0 fl   MCHC 32.8 30.0 - 36.0 g/dL   RDW 86.0 88.4 - 84.4 %   Platelets 184.0 150.0 - 400.0 K/uL   Neutrophils Relative % 59.2 43.0 - 77.0 %   Lymphocytes Relative 31.3 12.0 - 46.0 %   Monocytes Relative 6.4 3.0 - 12.0 %   Eosinophils Relative 2.4 0.0 - 5.0 %   Basophils Relative 0.7 0.0 - 3.0 %   Neutro Abs 4.0 1.4 - 7.7 K/uL   Lymphs Abs 2.1 0.7 - 4.0 K/uL   Monocytes Absolute 0.4 0.1 - 1.0 K/uL   Eosinophils Absolute 0.2 0.0 - 0.7 K/uL   Basophils Absolute 0.0 0.0 - 0.1 K/uL  Microalbumin / creatinine urine ratio   Collection Time: 03/02/24  9:56 AM  Result Value Ref Range   Microalb, Ur <0.7 mg/dL   Creatinine,U 37.5 mg/dL   Microalb Creat Ratio Unable to calculate 0.0 - 30.0 mg/g  Hemoglobin A1c   Collection Time: 03/02/24  9:56 AM  Result Value Ref Range   Hgb A1c MFr Bld 6.7 (H) 4.6 - 6.5 %  Comprehensive metabolic panel with GFR   Collection Time: 03/02/24  9:56 AM  Result Value Ref Range   Sodium 138 135 - 145 mEq/L   Potassium 4.1 3.5 - 5.1 mEq/L   Chloride 103  96 - 112 mEq/L   CO2 27 19 - 32 mEq/L   Glucose, Bld 135 (H) 70 - 99 mg/dL   BUN 14 6 - 23 mg/dL   Creatinine, Ser 9.10 0.40 - 1.50 mg/dL   Total Bilirubin 0.6 0.2 - 1.2 mg/dL   Alkaline Phosphatase 59 39 - 117 U/L   AST 29 0 - 37 U/L   ALT 51 0 - 53 U/L   Total Protein 6.9 6.0 - 8.3 g/dL   Albumin 4.3 3.5 - 5.2 g/dL   GFR 897.78 >39.99 mL/min   Calcium  9.6 8.4 - 10.5 mg/dL  Lipid panel   Collection Time: 03/02/24  9:56 AM  Result Value Ref Range   Cholesterol 168 0 - 200 mg/dL   Triglycerides 821.9 (H) 0.0 - 149.0 mg/dL   HDL 64.29 (L) >60.99 mg/dL   VLDL 64.3 0.0 - 59.9 mg/dL   LDL Cholesterol 97 0 - 99 mg/dL   Total CHOL/HDL Ratio 5    NonHDL 132.16     Assessment & Plan:   Problem List Items Addressed This Visit     Encounter for general adult medical examination with abnormal findings - Primary (Chronic)    Preventative protocols reviewed and updated unless pt declined. Discussed healthy diet and lifestyle.       Obesity, morbid, BMI 40.0-49.9 (HCC)   Reviewed healthy diet and lifestyle changes to effect sustainable weight loss.        Relevant Medications   dapagliflozin  propanediol (FARXIGA ) 10 MG TABS tablet   glimepiride  (AMARYL ) 4 MG tablet   pioglitazone  (ACTOS ) 30 MG tablet   tirzepatide  (MOUNJARO ) 5 MG/0.5ML Pen   Gout   Stable period off urate lowering medication.       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 The 10-year ASCVD risk score (Arnett DK, et al., 2019) is: 5%   Values used to calculate the score:     Age: 80 years     Clinically relevant sex: Male     Is Non-Hispanic African American: No     Diabetic: Yes     Tobacco smoker: No     Systolic Blood Pressure: 122 mmHg     Is BP treated: No     HDL Cholesterol: 35.7 mg/dL     Total Cholesterol: 168 mg/dL       Relevant Medications   atorvastatin  (LIPITOR) 40 MG tablet   dapagliflozin  propanediol (FARXIGA ) 10 MG TABS tablet   fenofibrate  (TRICOR ) 145 MG tablet   glimepiride  (AMARYL ) 4 MG tablet   pioglitazone  (ACTOS ) 30 MG tablet   tirzepatide  (MOUNJARO ) 5 MG/0.5ML Pen   Type 2 diabetes mellitus with diabetic neuropathy, unspecified (HCC)   Chronic, stable. Continue current medication regimen.  He notes he's taking 1/2 tablets of oral antihyperglycemics.  Continue mounjaro  5mg  Q2 wks       Relevant Medications   atorvastatin  (LIPITOR) 40 MG tablet   dapagliflozin  propanediol (FARXIGA ) 10 MG TABS tablet   glimepiride  (AMARYL ) 4 MG tablet   pioglitazone  (ACTOS ) 30 MG tablet   tirzepatide  (MOUNJARO ) 5 MG/0.5ML Pen   Metabolic dysfunction-associated steatotic liver disease (MASLD)   LFTs stable. On mounjaro  and actos . Normal fib4 score.  Rec update abd US  with  chronic R flank pain- he agrees to schedule for next visit.       Gallbladder polyp   LFTs normal - update liver US .  Constipation   Mounjaro  related        Meds ordered this encounter  Medications   atorvastatin  (LIPITOR) 40 MG tablet    Sig: Take 1 tablet (40 mg total) by mouth daily.    Dispense:  90 tablet    Refill:  3   dapagliflozin  propanediol (FARXIGA ) 10 MG TABS tablet    Sig: Take 1 tablet (10 mg total) by mouth daily.    Dispense:  90 tablet    Refill:  3   fenofibrate  (TRICOR ) 145 MG tablet    Sig: Take 1 tablet (145 mg total) by mouth daily.    Dispense:  90 tablet    Refill:  3   glimepiride  (AMARYL ) 4 MG tablet    Sig: Take 1 tablet (4 mg total) by mouth daily.    Dispense:  90 tablet    Refill:  3   pioglitazone  (ACTOS ) 30 MG tablet    Sig: Take 1 tablet (30 mg total) by mouth daily.    Dispense:  90 tablet    Refill:  3   tirzepatide  (MOUNJARO ) 5 MG/0.5ML Pen    Sig: Inject 5 mg into the skin every 14 (fourteen) days.    Dispense:  6 mL    Refill:  3    No orders of the defined types were placed in this encounter.   Patient Instructions  Consider tetanus shot, consider pneumonia shot.  You are doing well today  Continue current medicines Return as needed or in 6 months for diabetes follow up visit   Follow up plan: Return in about 6 months (around 09/07/2024) for follow up visit.  Anton Blas, MD

## 2024-03-09 NOTE — Assessment & Plan Note (Addendum)
 LFTs stable. On mounjaro  and actos . Normal fib4 score.  Rec update abd US  with chronic R flank pain- he agrees to schedule for next visit.

## 2024-03-09 NOTE — Assessment & Plan Note (Signed)
 Chronic, stable on statin ,fibrate - continue. The 10-year ASCVD risk score (Arnett DK, et al., 2019) is: 5%   Values used to calculate the score:     Age: 48 years     Clinically relevant sex: Male     Is Non-Hispanic African American: No     Diabetic: Yes     Tobacco smoker: No     Systolic Blood Pressure: 122 mmHg     Is BP treated: No     HDL Cholesterol: 35.7 mg/dL     Total Cholesterol: 168 mg/dL

## 2024-09-07 ENCOUNTER — Ambulatory Visit: Admitting: Family Medicine
